# Patient Record
Sex: Female | Born: 2000 | Hispanic: No | Marital: Single | Smoking: Never smoker
Health system: Southern US, Community
[De-identification: ages and names within clinical notes are randomized; demographics above are authoritative.]

## PROBLEM LIST (undated history)

## (undated) DIAGNOSIS — Z789 Other specified health status: Secondary | ICD-10-CM

## (undated) DIAGNOSIS — B009 Herpesviral infection, unspecified: Secondary | ICD-10-CM

## (undated) DIAGNOSIS — T7840XA Allergy, unspecified, initial encounter: Secondary | ICD-10-CM

## (undated) DIAGNOSIS — F419 Anxiety disorder, unspecified: Secondary | ICD-10-CM

## (undated) DIAGNOSIS — F32A Depression, unspecified: Secondary | ICD-10-CM

## (undated) HISTORY — DX: Allergy, unspecified, initial encounter: T78.40XA

## (undated) HISTORY — DX: Herpesviral infection, unspecified: B00.9

## (undated) HISTORY — DX: Other specified health status: Z78.9

## (undated) HISTORY — PX: WISDOM TOOTH EXTRACTION: SHX21

## (undated) HISTORY — DX: Depression, unspecified: F32.A

## (undated) HISTORY — DX: Anxiety disorder, unspecified: F41.9

---
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
## 2019-01-02 ENCOUNTER — Encounter: Payer: Self-pay | Admitting: Family Medicine

## 2019-01-02 ENCOUNTER — Ambulatory Visit (INDEPENDENT_AMBULATORY_CARE_PROVIDER_SITE_OTHER): Payer: Managed Care, Other (non HMO) | Admitting: Family Medicine

## 2019-01-02 VITALS — BP 110/70 | HR 78 | Temp 98.4°F | Resp 12 | Ht 61.05 in | Wt 129.0 lb

## 2019-01-02 DIAGNOSIS — R05 Cough: Secondary | ICD-10-CM

## 2019-01-02 DIAGNOSIS — K219 Gastro-esophageal reflux disease without esophagitis: Secondary | ICD-10-CM

## 2019-01-02 DIAGNOSIS — R059 Cough, unspecified: Secondary | ICD-10-CM

## 2019-01-02 MED ORDER — BENZONATATE 100 MG PO CAPS: 200.0000 mg | ORAL_CAPSULE | Freq: Two times a day (BID) | ORAL | 0 refills | Status: AC | PRN

## 2019-01-02 MED ORDER — FLUTICASONE PROPIONATE 50 MCG/ACT NA SUSP: 1.0000 | Freq: Two times a day (BID) | NASAL | 3 refills | Status: DC

## 2019-01-02 NOTE — Patient Instructions (Addendum)
A few things to remember from today's visit:   Cough - Plan: benzonatate (TESSALON) 100 MG capsule  Gastroesophageal reflux disease without esophagitis  I do not think chest x-ray is needed today. ?  Allergies ?  GERD. We can try over-the-counter Zyrtec 10 mg in the morning and Flonase intranasal spray. You can also range her nose with saline water. If cough is not any better in 2 to 3 weeks then we can try a medication for acid reflux, omeprazole 20 mg.   Please be sure medication list is accurate. If a new problem present, please set up appointment sooner than planned today.

## 2019-01-02 NOTE — Progress Notes (Signed)
ACUTE VISIT  HPI:  Chief Complaint  Patient presents with  . Cough    productive cough for over a week    Daisy White is a 18 y.o.female here today with her mother complaining of 1 to 2 weeks of cough. Initially cough was nonproductive, for the past couple days she has had more productive cough.  She is unable to bring sputum up. She has not had other associated symptoms, since the beginning it has been just cough.  No associated wheezing or dyspnea.  Cough  This is a new problem. The current episode started 1 to 4 weeks ago. The problem has been unchanged. The cough is productive of sputum. Associated symptoms include chest pain, heartburn, postnasal drip and rhinorrhea. Pertinent negatives include no chills, ear congestion, ear pain, fever, headaches, hemoptysis, myalgias, nasal congestion, rash, sore throat, shortness of breath, sweats, weight loss or wheezing. The symptoms are aggravated by lying down. She has tried OTC cough suppressant for the symptoms. The treatment provided moderate relief. There is no history of asthma or environmental allergies.   No Hx of recent travel. No sick contact. No known insect bite.  She has no known history of allergies but both of her parents and her sister receive allergy shots.  OTC medications for this problem: Robitussin, NyQuil, and Zycam  She is reporting heartburn and mid chest pain a few days ago that lasted a few minutes.   Review of Systems  Constitutional: Negative for activity change, appetite change, chills, fatigue, fever and weight loss.  HENT: Positive for congestion, postnasal drip and rhinorrhea. Negative for ear pain, mouth sores, sinus pressure, sore throat and trouble swallowing.   Respiratory: Positive for cough. Negative for hemoptysis, shortness of breath and wheezing.   Cardiovascular: Positive for chest pain.  Gastrointestinal: Positive for heartburn. Negative for abdominal pain, diarrhea, nausea and  vomiting.  Musculoskeletal: Negative for back pain, joint swelling, myalgias and neck pain.  Skin: Negative for rash.  Allergic/Immunologic: Negative for environmental allergies.  Neurological: Negative for syncope, weakness and headaches.  Hematological: Negative for adenopathy. Does not bruise/bleed easily.    No current outpatient medications on file prior to visit.   No current facility-administered medications on file prior to visit.      Past Medical History:  Diagnosis Date  . Medical history non-contributory    No Known Allergies  Social History   Socioeconomic History  . Marital status: Single    Spouse name: Not on file  . Number of children: Not on file  . Years of education: Not on file  . Highest education level: Not on file  Occupational History  . Not on file  Social Needs  . Financial resource strain: Not on file  . Food insecurity:    Worry: Not on file    Inability: Not on file  . Transportation needs:    Medical: Not on file    Non-medical: Not on file  Tobacco Use  . Smoking status: Never Smoker  . Smokeless tobacco: Never Used  Substance and Sexual Activity  . Alcohol use: No  . Drug use: No  . Sexual activity: Never  Lifestyle  . Physical activity:    Days per week: Not on file    Minutes per session: Not on file  . Stress: Not on file  Relationships  . Social connections:    Talks on phone: Not on file    Gets together: Not on file  Attends religious service: Not on file    Active member of club or organization: Not on file    Attends meetings of clubs or organizations: Not on file    Relationship status: Not on file  Other Topics Concern  . Not on file  Social History Narrative  . Not on file    Vitals:   01/02/19 1110  BP: 110/70  Pulse: 78  Resp: 12  Temp: 98.4 F (36.9 C)  SpO2: 98%   Body mass index is 24.33 kg/m.  Physical Exam  Nursing note and vitals reviewed. Constitutional: She is oriented to person,  place, and time. She appears well-developed and well-nourished. She does not appear ill. No distress.  HENT:  Head: Normocephalic and atraumatic.  Nose: Rhinorrhea and septal deviation present. Right sinus exhibits no maxillary sinus tenderness. Left sinus exhibits no maxillary sinus tenderness.  Mouth/Throat: Oropharynx is clear and moist and mucous membranes are normal.  Right hypertrophic turbinate. Postnasal drainage.  Eyes: Conjunctivae are normal.  Cardiovascular: Normal rate and regular rhythm.  Murmur (soft SEM RUSB) heard. Respiratory: Effort normal and breath sounds normal. No respiratory distress.  A couple episodes of productive cough during visit.  GI: Soft. She exhibits no mass. There is abdominal tenderness in the epigastric area. There is no rigidity, no rebound and no guarding.  Lymphadenopathy:    She has no cervical adenopathy.  Neurological: She is alert and oriented to person, place, and time. She has normal strength. Gait normal.  Skin: Skin is warm. No rash noted. No erythema.  Psychiatric: She has a normal mood and affect.  Well groomed, good eye contact.    ASSESSMENT AND PLAN:   Jamy was seen today for cough.  Diagnoses and all orders for this visit:  Cough We discussed possible etiologies. She did not seem to have a URI recently. ?  GERD. ?  Allergies. Lung auscultation is negative today, so I do not think imaging is necessary.  I recommend trying first intranasal steroids, Flonase, and OTC antihistaminic like Zyrtec 10 mg daily.  If not any better in 2 to 3 weeks we can try a PPI, omeprazole 20 mg for a few weeks.  Instructed about warning signs.  -     benzonatate (TESSALON) 100 MG capsule; Take 2 capsules (200 mg total) by mouth 2 (two) times daily as needed for up to 10 days.  Gastroesophageal reflux disease without esophagitis GERD precautions recommended for now. This problem could be contributing to her cough. We will consider treatment  with PPIs if cough is persistent.   Return if symptoms worsen or fail to improve.    Lilyauna Miedema G. [place], MD  [facility]. [place] office.

## 2019-01-14 ENCOUNTER — Other Ambulatory Visit: Payer: Self-pay | Admitting: Family Medicine

## 2019-01-14 ENCOUNTER — Ambulatory Visit: Payer: Self-pay

## 2019-01-14 MED ORDER — DOXYCYCLINE HYCLATE 100 MG PO TABS: 100.0000 mg | ORAL_TABLET | Freq: Two times a day (BID) | ORAL | 0 refills | Status: AC

## 2019-01-14 NOTE — Progress Notes (Signed)
Because worsening respiratory symptoms, I did send prescription for doxycycline 100 mg to take twice daily for 7 days. Betty [place], MD

## 2019-01-14 NOTE — Telephone Encounter (Signed)
ret'd call to pt.  Reported onset of sinus congestion and pressure since approx. Sunday or Monday.  Reported she feels bilateral nasal swelling and stuffiness.  Stated she has moderate pain in nasal region, and headache.  C/o yellow-green nasal drainage.  Also reported intermittent productive cough with yellow-green phlegm.  C/o sore throat and post nasal drip.  Reported she came home flushed today; no fever when checked.  C/o intermittent chills during night.  Reported ringing in her ears and ear pressure last night; has since subsided.  Advised she will need to be evaluated in the office, before an antibiotic can be prescribed.  The pt. stated that her father is requesting to check with Dr. Martinique, to see if she will order an antibiotic, instead of having the pt. come into the office, and possibly be further exposed to the flu.  Asked if it would be possible to have Dr. Martinique or her nurse call and speak with pt. Advised will send Triage note to Dr. Martinique with this request.  Verb. Understanding.      Reason for Disposition . [1] Sinus pain (not just congestion) AND [2] fever    No fever; c/o chills at night.  C/o sinus pressure and yellow-green drainage x 3 days  Answer Assessment - Initial Assessment Questions 1. LOCATION: "Where does it hurt?"      Nasal pain and headache  2. ONSET: "When did the sinus pain start?"  (e.g., hours, days)     Sunday or Monday 3. SEVERITY: "How bad is the pain?"   (Scale 1-10; mild, moderate or severe)   - MILD (1-3): doesn't interfere with normal activities    - MODERATE (4-7): interferes with normal activities (e.g., work or school) or awakens from sleep   - SEVERE (8-10): excruciating pain and patient unable to do any normal activities        Moderate  4. RECURRENT SYMPTOM: "Have you ever had sinus problems before?" If so, ask: "When was the last time?" and "What happened that time?"      Hx of allergies and sinus infections 5. NASAL CONGESTION: "Is the nose  blocked?" If so, ask, "Can you open it or must you breathe through the mouth?"     Stuffiness/ swelling in bilat. Nasal passages 6. NASAL DISCHARGE: "Do you have discharge from your nose?" If so ask, "What color?"     Yellow green 7. FEVER: "Do you have a fever?" If so, ask: "What is it, how was it measured, and when did it start?"      Flushed, but no fever 8. OTHER SYMPTOMS: "Do you have any other symptoms?" (e.g., sore throat, cough, earache, difficulty breathing)     Sore throat with PND; painful throat this week; c/o ringing in ears and pressure last night; denied any shortness of breath   9. PREGNANCY: "Is there any chance you are pregnant?" "When was your last menstrual period?"     LMP: 7 days ago; ended today  Protocols used: SINUS PAIN OR CONGESTION-A-AH

## 2019-01-14 NOTE — Telephone Encounter (Signed)
Dr. Swaziland sent in antibiotic for patient , left vm informing that Rx had been sent to the pharmacy.

## 2019-02-25 ENCOUNTER — Other Ambulatory Visit: Payer: Self-pay | Admitting: Family Medicine

## 2019-10-20 ENCOUNTER — Ambulatory Visit (INDEPENDENT_AMBULATORY_CARE_PROVIDER_SITE_OTHER): Payer: Managed Care, Other (non HMO) | Admitting: Family Medicine

## 2019-10-20 ENCOUNTER — Encounter: Payer: Self-pay | Admitting: Family Medicine

## 2019-10-20 ENCOUNTER — Other Ambulatory Visit: Payer: Self-pay

## 2019-10-20 VITALS — BP 110/62 | HR 83 | Temp 97.3°F | Resp 12 | Ht 61.08 in | Wt 126.6 lb

## 2019-10-20 DIAGNOSIS — R300 Dysuria: Secondary | ICD-10-CM

## 2019-10-20 DIAGNOSIS — R310 Gross hematuria: Secondary | ICD-10-CM | POA: Diagnosis not present

## 2019-10-20 DIAGNOSIS — N390 Urinary tract infection, site not specified: Secondary | ICD-10-CM

## 2019-10-20 DIAGNOSIS — Z3009 Encounter for other general counseling and advice on contraception: Secondary | ICD-10-CM

## 2019-10-20 DIAGNOSIS — R319 Hematuria, unspecified: Secondary | ICD-10-CM

## 2019-10-20 LAB — POCT URINALYSIS DIPSTICK
Bilirubin, UA: NEGATIVE
Blood, UA: NEGATIVE
Glucose, UA: NEGATIVE
Ketones, UA: NEGATIVE
Nitrite, UA: NEGATIVE
Protein, UA: NEGATIVE
Spec Grav, UA: 1.015 (ref 1.010–1.025)
Urobilinogen, UA: 0.2 E.U./dL
pH, UA: 6 (ref 5.0–8.0)

## 2019-10-20 MED ORDER — NITROFURANTOIN MONOHYD MACRO 100 MG PO CAPS: 100.0000 mg | ORAL_CAPSULE | Freq: Two times a day (BID) | ORAL | 0 refills | Status: AC

## 2019-10-20 NOTE — Patient Instructions (Signed)
A few things to remember from today's visit:   Dysuria - Plan: POC Urinalysis Dipstick, Culture, Urine  Gross hematuria  Urinary tract infection with hematuria, site unspecified - Plan: nitrofurantoin, macrocrystal-monohydrate, (MACROBID) 100 MG capsule    Adequate fluid intake, avoid holding urine for long hours, and over the counter Vit C OR cranberry capsules might help.  Today we will treat empirically with antibiotic, which we might need to change when urine culture comes back depending of bacteria susceptibility.  Seek immediate medical attention if severe abdominal pain, vomiting, fever/chills, or worsening symptoms. F/U if symptomatic are not any better after 2-3 days of antibiotic treatment.  Please be sure medication list is accurate. If a new problem present, please set up appointment sooner than planned today.

## 2019-10-20 NOTE — Progress Notes (Signed)
ACUTE VISIT   HPI:  Chief Complaint  Patient presents with  . Dysuria    Daisy White is a 18 y.o. female, who is here today complaining of a day of gross hematuria,dysuria,urinary frequency,and suprapubic abdominal pain. Negative for urine urgency or incontinence.  Mild lower back pain,bilateral,no radiated. No Hx of back pain.  Suprapubic abdominal pain is intermittent cramps , 5-6/10. Symptoms seem to be getting worse.  Denies associated fever,chills,decreased appetite,nausea,vomiting,changes in bowel habits,vaginal bleeding,or discharge.  No hx of UTI or nephrolithiasis. She has taken Advil for pain.  She just started sexual activity and she would like to discuss birth control options. One sexual partner. LMP 1 week ago.   Review of Systems  Constitutional: Negative for activity change and fatigue.  HENT: Negative for mouth sores and nosebleeds.   Gastrointestinal: Negative for abdominal pain, nausea and vomiting.  Genitourinary: Negative for decreased urine volume, difficulty urinating, dyspareunia and genital sores.  Musculoskeletal: Positive for back pain. Negative for arthralgias and gait problem.  Skin: Negative for pallor and rash.  Neurological: Negative for weakness and headaches.  Rest see pertinent positives and negatives per HPI.   Current Outpatient Medications on File Prior to Visit  Medication Sig Dispense Refill  . fluticasone (FLONASE) 50 MCG/ACT nasal spray PLACE 1 SPRAY INTO BOTH NOSTRILS 2 (TWO) TIMES DAILY. 16 g 3   No current facility-administered medications on file prior to visit.      Past Medical History:  Diagnosis Date  . Medical history non-contributory    No Known Allergies  Social History   Socioeconomic History  . Marital status: Single    Spouse name: Not on file  . Number of children: Not on file  . Years of education: Not on file  . Highest education level: Not on file  Occupational History  . Not on file   Social Needs  . Financial resource strain: Not on file  . Food insecurity    Worry: Not on file    Inability: Not on file  . Transportation needs    Medical: Not on file    Non-medical: Not on file  Tobacco Use  . Smoking status: Never Smoker  . Smokeless tobacco: Never Used  Substance and Sexual Activity  . Alcohol use: No  . Drug use: No  . Sexual activity: Never  Lifestyle  . Physical activity    Days per week: Not on file    Minutes per session: Not on file  . Stress: Not on file  Relationships  . Social Herbalist on phone: Not on file    Gets together: Not on file    Attends religious service: Not on file    Active member of club or organization: Not on file    Attends meetings of clubs or organizations: Not on file    Relationship status: Not on file  Other Topics Concern  . Not on file  Social History Narrative  . Not on file    Vitals:   10/20/19 1534  BP: 110/62  Pulse: 83  Resp: 12  Temp: (!) 97.3 F (36.3 C)  SpO2: 98%   Body mass index is 23.86 kg/m.   Physical Exam  Nursing note and vitals reviewed. Constitutional: She is oriented to person, place, and time. She appears well-developed and well-nourished. No distress.  HENT:  Head: Normocephalic and atraumatic.  Mouth/Throat: Oropharynx is clear and moist and mucous membranes are normal.  Eyes: Conjunctivae  are normal.  Cardiovascular: Normal rate and regular rhythm.  Respiratory: Effort normal and breath sounds normal. No respiratory distress.  GI: Soft. She exhibits no mass. There is no abdominal tenderness. There is no CVA tenderness.  Musculoskeletal:        General: No edema.  Lymphadenopathy:    She has no cervical adenopathy.  Neurological: She is alert and oriented to person, place, and time. She has normal strength. Gait normal.  Skin: Skin is warm. No erythema.  Psychiatric: She has a normal mood and affect.  Good eye contact,well groomed.    ASSESSMENT AND  PLAN:   Myasia was seen today for dysuria.  Diagnoses and all orders for this visit:  Dysuria Urine dipstick with 1+ leuk,rest negative. Will follow Ucx results.  -     POC Urinalysis Dipstick -     Culture, Urine  Gross hematuria Possible etiologies discussed. For now no further studies , will treat as UTI. Instructed about warning signs.  Urinary tract infection with hematuria, site unspecified Empiric abx treatment started today and will be tailored according to Ucx results and susceptibility report.  Clearly instructed about warning signs. F/U if symptoms persist.  -     nitrofurantoin, macrocrystal-monohydrate, (MACROBID) 100 MG capsule; Take 1 capsule (100 mg total) by mouth 2 (two) times daily for 5 days.  Encounter for counseling regarding contraception We discussed options: OCP,nexplanon,IUD mirena,Nuvaring, patches,and depo Provera. Reviewed side effects of hormonal therapy. Explained that birth control do not prevent STD's,so encouraged condom use.  She would like to talk with her mother before she makes a decision.   25 min face to face OV. > 50% was dedicated to discussion of Dx's, prognosis, treatment options, and some side effects of medications.    Return if symptoms worsen or fail to improve.   Regan Llorente G. [place], MD  [facility]. [place] office.

## 2019-10-22 LAB — URINE CULTURE
MICRO NUMBER:: 1150942
SPECIMEN QUALITY:: ADEQUATE

## 2019-11-23 ENCOUNTER — Telehealth (INDEPENDENT_AMBULATORY_CARE_PROVIDER_SITE_OTHER): Payer: Managed Care, Other (non HMO) | Admitting: Family Medicine

## 2019-11-23 DIAGNOSIS — R197 Diarrhea, unspecified: Secondary | ICD-10-CM

## 2019-11-23 DIAGNOSIS — R1110 Vomiting, unspecified: Secondary | ICD-10-CM | POA: Diagnosis not present

## 2019-11-23 DIAGNOSIS — Z7189 Other specified counseling: Secondary | ICD-10-CM | POA: Diagnosis not present

## 2019-11-23 NOTE — Progress Notes (Signed)
Virtual Visit via Video Note  I connected with Daisy White on 11/23/19 at  1:00 PM EST by a video enabled telemedicine application 2/2 COVID-19 pandemic and verified that I am speaking with the correct person using two identifiers.  Location patient: home Location provider:work or home office Persons participating in the virtual visit: patient, provider  I discussed the limitations of evaluation and management by telemedicine and the availability of in person appointments. The patient expressed understanding and agreed to proceed.   HPI: Pt is a 19 yo female w/ no sig pmh followed by Dr. Sw[place]il[place].  Pt seen for acute issue.   Endorses stomach ache on Thursday.  Symptoms progressed into vomiting and diarrhea that evening which lasted a few days.  Pt notes resolution of symptoms today.  Able to eat and drink without issue.  Pt denies HA, fever, cough, sore throat, facial pain/pressure, sick contacts.  Pt had [employer] pizza on Thursday which was out of the ordinary.  Also endorses recent EtOH consumption.  Pt states her parents think her symptoms may be COVID-19.  Pt's mom has surgery tomorrow, was advised to monitor for symptoms.  Pt is staying in her room away from her parents.  Scheduled a COVID test at [facility] in a few days.  Pt mentions she has an office appt on Wed for her first Depo injection.  ROS: See pertinent positives and negatives per HPI.  Past Medical History:  Diagnosis Date  . Medical history non-contributory     No past surgical history on file.  Family History  Problem Relation Age of Onset  . Hypertension Mother   . Depression Mother   . Hypertension Father   . Heart disease Maternal Grandmother   . Diabetes Maternal Grandmother   . Cancer Maternal Grandfather   . ADD / ADHD Sister      Current Outpatient Medications:  .  fluticasone (FLONASE) 50 MCG/ACT nasal spray, PLACE 1 SPRAY INTO BOTH NOSTRILS 2 (TWO) TIMES DAILY., Disp: 16 g, Rfl: 3  EXAM:  VITALS per  patient if applicable:  GENERAL: alert, oriented, appears well and in no acute distress  HEENT: atraumatic, conjunctiva clear, no obvious abnormalities on inspection of external nose and ears  NECK: normal movements of the head and neck  LUNGS: on inspection no signs of respiratory distress, breathing rate appears normal, no obvious gross SOB, gasping or wheezing  CV: no obvious cyanosis  MS: moves all visible extremities without noticeable abnormality  PSYCH/NEURO: pleasant and cooperative, no obvious depression or anxiety, speech and thought processing grossly intact  ASSESSMENT AND PLAN:  Discussed the following assessment and plan:  Vomiting and diarrhea -resolved -likely 2/2 viral gastroenteritis -continue supportive care: bland diet, imodium prn.  If needed can send rx for zofran. -given precautions  Educated about COVID-19 virus infection -discussed s/s -given info on area COVID testing sites. -consider testing for continued or worsened symptoms  F/u prn.  Pt advised to consider rescheduling appt Wed given recent symptoms.     I discussed the assessment and treatment plan with the patient. The patient was provided an opportunity to ask questions and all were answered. The patient agreed with the plan and demonstrated an understanding of the instructions.   The patient was advised to call back or seek an in-person evaluation if the symptoms worsen or if the condition fails to improve as anticipated.   Deeann Saint, MD

## 2019-11-24 ENCOUNTER — Telehealth: Payer: Self-pay

## 2019-11-24 NOTE — Telephone Encounter (Signed)
Copied from CRM [id number]. Topic: General - Other >> Nov 24, 2019  1:20 PM Daisy White A wrote: Patient stated that she was advised to leave a message for Dr. [place] in regards to her feeling better and still wanting to attend her appointment in office tomorrow. Please advise

## 2019-11-25 ENCOUNTER — Encounter: Payer: Self-pay | Admitting: Family Medicine

## 2019-11-25 ENCOUNTER — Telehealth (INDEPENDENT_AMBULATORY_CARE_PROVIDER_SITE_OTHER): Payer: Managed Care, Other (non HMO) | Admitting: Family Medicine

## 2019-11-25 ENCOUNTER — Other Ambulatory Visit: Payer: Self-pay

## 2019-11-25 DIAGNOSIS — K529 Noninfective gastroenteritis and colitis, unspecified: Secondary | ICD-10-CM | POA: Diagnosis not present

## 2019-11-25 DIAGNOSIS — R109 Unspecified abdominal pain: Secondary | ICD-10-CM

## 2019-11-25 DIAGNOSIS — Z3009 Encounter for other general counseling and advice on contraception: Secondary | ICD-10-CM

## 2019-11-25 NOTE — Progress Notes (Addendum)
Virtual Visit via Video Note   I connected with Daisy White on 11/25/19 by a video enabled telemedicine application and verified that I am speaking with the correct person using two identifiers.  Location patient: home Location provider:work office Persons participating in the virtual visit: patient, provider  I discussed the limitations of evaluation and management by telemedicine and the availability of in person appointments. The patient expressed understanding and agreed to proceed.   HPI: Daisy White is a 19 yo female c/o abdominal pain that started yesterday,initially cramps and now achy like pain,3/10. Pain has been constant. She has not identified exacerbating or alleviating factors. She was evaluated recently for GI symptoms, 11/23/2019.  She had some abdominal pain about a week ago, associated N/V and diarrhea; all these have resolved. Occasionally back pain. Abdominal pain first noted in RUQ and now in LUQ.  She has taken Ibuprofen and Tylenol.  Last bowel movement yesterday,formed. No melena or blood in stool. No urinary symptoms. She has been in quarantine to date. She lives with her parents and they have not been sick. She stayed with her boyfriend x 2 days (4 days ago). Negative for fever, chills, unusual fatigue,appetite changes, sore throat, runny nose, nasal congestion, cough, wheezing,SOB, and skin rash.  Mild body aches.  No changes in the smell or taste. She has appointment today for COVID-19 screen.  On 10/20/19 we discussed birth control options, she has decided to try Depo Provera. LMP 15 days ago.  ROS: See pertinent positives and negatives per HPI.  Past Medical History:  Diagnosis Date  . Medical history non-contributory     No past surgical history on file.  Family History  Problem Relation Age of Onset  . Hypertension Mother   . Depression Mother   . Hypertension Father   . Heart disease Maternal Grandmother   . Diabetes Maternal Grandmother   .  Cancer Maternal Grandfather   . ADD / ADHD Sister     Social History   Socioeconomic History  . Marital status: Single    Spouse name: Not on file  . Number of children: Not on file  . Years of education: Not on file  . Highest education level: Not on file  Occupational History  . Not on file  Tobacco Use  . Smoking status: Never Smoker  . Smokeless tobacco: Never Used  Substance and Sexual Activity  . Alcohol use: No  . Drug use: No  . Sexual activity: Never  Other Topics Concern  . Not on file  Social History Narrative  . Not on file   Social Determinants of Health   Financial Resource Strain:   . Difficulty of Paying Living Expenses: Not on file  Food Insecurity:   . Worried About Programme researcher, broadcasting/film/video in the Last Year: Not on file  . Ran Out of Food in the Last Year: Not on file  Transportation Needs:   . Lack of Transportation (Medical): Not on file  . Lack of Transportation (Non-Medical): Not on file  Physical Activity:   . Days of Exercise per Week: Not on file  . Minutes of Exercise per Session: Not on file  Stress:   . Feeling of Stress : Not on file  Social Connections:   . Frequency of Communication with Friends and Family: Not on file  . Frequency of Social Gatherings with Friends and Family: Not on file  . Attends Religious Services: Not on file  . Active Member of Clubs or Organizations: Not on file  .  Attends Archivist Meetings: Not on file  . Marital Status: Not on file  Intimate Partner Violence:   . Fear of Current or Ex-Partner: Not on file  . Emotionally Abused: Not on file  . Physically Abused: Not on file  . Sexually Abused: Not on file    Current Outpatient Medications:  .  fluticasone (FLONASE) 50 MCG/ACT nasal spray, PLACE 1 SPRAY INTO BOTH NOSTRILS 2 (TWO) TIMES DAILY., Disp: 16 g, Rfl: 3  EXAM:  VITALS per patient if applicable:LMP 53/74/8270   GENERAL: alert, oriented, appears well and in no acute distress  HEENT:  atraumatic, conjunctiva clear, no obvious abnormalities on inspection.  NECK: normal movements of the head and neck  LUNGS: on inspection no signs of respiratory distress, breathing rate appears normal, no obvious gross SOB, gasping or wheezing  CV: no obvious cyanosis  PSYCH/NEURO: pleasant and cooperative, no obvious depression or anxiety, speech and thought processing grossly intact  ASSESSMENT AND PLAN:  Discussed the following assessment and plan:  Abdominal pain, unspecified abdominal location We discussed possible etiologies:? Dyspepsia. This could also be related to viral gastroenteritis. Recommend small portions and bland diet for now. History does not suggest a serious process, so I do not think imaging is needed at this time. Clearly instructed about warning signs.  I will need to see her in the office if problem is persistent.  Gastroenteritis Most symptoms have resolved. Adequate hydration. Continue contact precautions. Pending COVID-19 testing today.  Encounter for counseling regarding contraception We will start Depo-Provera injections once above problem resolves.   I discussed the assessment and treatment plan with the patient. She was provided an opportunity to ask questions and all were answered. She agreed with the plan and demonstrated an understanding of the instructions.    Return if symptoms worsen or fail to improve.    Leafy Motsinger [place], MD

## 2020-06-21 ENCOUNTER — Ambulatory Visit: Payer: Managed Care, Other (non HMO) | Admitting: Family Medicine

## 2020-06-21 ENCOUNTER — Encounter: Payer: Self-pay | Admitting: Family Medicine

## 2020-06-21 ENCOUNTER — Other Ambulatory Visit: Payer: Self-pay

## 2020-06-21 VITALS — BP 110/70 | HR 86 | Temp 97.9°F | Resp 12 | Ht 61.08 in | Wt 128.4 lb

## 2020-06-21 DIAGNOSIS — F320 Major depressive disorder, single episode, mild: Secondary | ICD-10-CM | POA: Diagnosis not present

## 2020-06-21 DIAGNOSIS — Z3009 Encounter for other general counseling and advice on contraception: Secondary | ICD-10-CM | POA: Diagnosis not present

## 2020-06-21 DIAGNOSIS — Z111 Encounter for screening for respiratory tuberculosis: Secondary | ICD-10-CM | POA: Diagnosis not present

## 2020-06-21 DIAGNOSIS — F332 Major depressive disorder, recurrent severe without psychotic features: Secondary | ICD-10-CM | POA: Insufficient documentation

## 2020-06-21 NOTE — Progress Notes (Signed)
Chief Complaint  Patient presents with   Contraception   HPI: Daisy White is a 19 y.o. female, who is here today complaining of depressed mood, which she attributes to OCP. Started on OCP's after having an abortion (8 weeks) in 02/2020.  OCP's have helps with menstrual flow and dysmenorrhea. She has not tried other birth control in the past,except for condoms for the past few months.  No motivation and decreased enjoyment of activities she used to love. Denies suicidal thoughts. Feeling irritable. She has not been on medication before. + Fatigue. Sleeping 7-8 hours.  Stressful relation with her father,she moved out of her parents' house. She keeps often communication with her mother. She is now living with her boyfriend and his brother. She feels safe at home.  She is trying to establish with psychotherapist.  Boyfriend recently started on Wellbutrin for depression.  Needs form to be completed and TB screening for school,major in biology.  Review of Systems  Constitutional: Negative for chills and fever.  Respiratory: Negative for shortness of breath.   Cardiovascular: Negative for chest pain and palpitations.  Gastrointestinal: Negative for abdominal pain, nausea and vomiting.  Genitourinary: Negative for decreased urine volume, dysuria, hematuria, pelvic pain, vaginal bleeding and vaginal discharge.  Musculoskeletal: Negative for gait problem and myalgias.  Neurological: Negative for syncope, weakness and headaches.  Psychiatric/Behavioral: Negative for behavioral problems and confusion.  Rest see pertinent positives and negatives per HPI.  Current Outpatient Medications on File Prior to Visit  Medication Sig Dispense Refill   fluticasone (FLONASE) 50 MCG/ACT nasal spray PLACE 1 SPRAY INTO BOTH NOSTRILS 2 (TWO) TIMES DAILY. 16 g 3   SPRINTEC 28 0.25-35 MG-MCG tablet Take 1 tablet by mouth daily.     No current facility-administered medications on file prior  to visit.    Past Medical History:  Diagnosis Date   Medical history non-contributory    No Known Allergies  Social History   Socioeconomic History   Marital status: Single    Spouse name: Not on file   Number of children: Not on file   Years of education: Not on file   Highest education level: Not on file  Occupational History   Not on file  Tobacco Use   Smoking status: Never Smoker   Smokeless tobacco: Never Used  Substance and Sexual Activity   Alcohol use: No   Drug use: No   Sexual activity: Never  Other Topics Concern   Not on file  Social History Narrative   Not on file   Social Determinants of Health   Financial Resource Strain:    Difficulty of Paying Living Expenses:   Food Insecurity:    Worried About Programme researcher, broadcasting/film/video in the Last Year:    Barista in the Last Year:   Transportation Needs:    Freight forwarder (Medical):    Lack of Transportation (Non-Medical):   Physical Activity:    Days of Exercise per Week:    Minutes of Exercise per Session:   Stress:    Feeling of Stress :   Social Connections:    Frequency of Communication with Friends and Family:    Frequency of Social Gatherings with Friends and Family:    Attends Religious Services:    Active Member of Clubs or Organizations:    Attends Banker Meetings:    Marital Status:    Vitals:   06/21/20 1451  BP: 110/70  Pulse: 86  Resp:  12  Temp: 97.9 F (36.6 C)  SpO2: 97%   Body mass index is 24.19 kg/m.  Physical Exam Vitals and nursing note reviewed.  Constitutional:      General: She is not in acute distress.    Appearance: She is well-developed and normal weight.  HENT:     Head: Normocephalic and atraumatic.  Eyes:     Conjunctiva/sclera: Conjunctivae normal.     Pupils: Pupils are equal, round, and reactive to light.  Cardiovascular:     Rate and Rhythm: Normal rate and regular rhythm.     Heart sounds: No murmur  heard.   Pulmonary:     Effort: Pulmonary effort is normal. No respiratory distress.     Breath sounds: Normal breath sounds.  Abdominal:     Palpations: Abdomen is soft. There is no hepatomegaly or mass.     Tenderness: There is no abdominal tenderness.  Lymphadenopathy:     Cervical: No cervical adenopathy.  Skin:    General: Skin is warm.     Findings: No erythema or rash.  Neurological:     Mental Status: She is alert and oriented to person, place, and time.     Cranial Nerves: No cranial nerve deficit.     Gait: Gait normal.  Psychiatric:     Comments: Well groomed, good eye contact.   ASSESSMENT AND PLAN:  Daisy White was seen today for contraception.  Diagnoses and all orders for this visit:  Encounter for counseling regarding contraception We discussed options. IUD,implant,oral injectable. Side effects discussed. She would like to continue current OCP for a little linger and will let me know if she decides about Mirana or Depo Provera. STD prevention discussed.  Screening-pulmonary TB -     QuantiFERON-TB Gold Plus  Depression, major, single episode, mild (HCC) She prefers to hold on pharmacologic treatment. She is planning on establishing with psychologist/psychotherapist.   Return in about 1 year (around 06/21/2021), or if symptoms worsen or fail to improve.   Braxon Suder G. [place], MD  [facility]. [place] office.  Discharge Instructions       A few things to remember from today's visit:  Continue oral birth control. Let me know if you want Depo Provera. Try to establish with therapist.  If you need refills please call your pharmacy. Do not use My Chart to request refills or for acute issues that need immediate attention.    Please be sure medication list is accurate. If a new problem present, please set up appointment sooner than planned today.

## 2020-06-21 NOTE — Patient Instructions (Addendum)
A few things to remember from today's visit:  Continue oral birth control. Let me know if you want Depo Provera. Try to establish with therapist.  If you need refills please call your pharmacy. Do not use My Chart to request refills or for acute issues that need immediate attention.    Please be sure medication list is accurate. If a new problem present, please set up appointment sooner than planned today.

## 2020-06-23 LAB — QUANTIFERON-TB GOLD PLUS
Mitogen-NIL: >10 IU/mL
NIL: 0.02 IU/mL
QuantiFERON-TB Gold Plus: NEGATIVE
TB1-NIL: 0 IU/mL
TB2-NIL: 0.02 IU/mL

## 2020-08-25 ENCOUNTER — Ambulatory Visit (INDEPENDENT_AMBULATORY_CARE_PROVIDER_SITE_OTHER): Payer: 59 | Admitting: Psychology

## 2020-08-25 DIAGNOSIS — F331 Major depressive disorder, recurrent, moderate: Secondary | ICD-10-CM

## 2020-08-25 DIAGNOSIS — F411 Generalized anxiety disorder: Secondary | ICD-10-CM | POA: Diagnosis not present

## 2020-08-31 ENCOUNTER — Ambulatory Visit (INDEPENDENT_AMBULATORY_CARE_PROVIDER_SITE_OTHER): Payer: 59 | Admitting: Psychology

## 2020-08-31 DIAGNOSIS — F411 Generalized anxiety disorder: Secondary | ICD-10-CM

## 2020-08-31 DIAGNOSIS — F331 Major depressive disorder, recurrent, moderate: Secondary | ICD-10-CM | POA: Diagnosis not present

## 2020-09-02 ENCOUNTER — Encounter: Payer: Self-pay | Admitting: Family Medicine

## 2020-09-02 ENCOUNTER — Ambulatory Visit: Payer: Managed Care, Other (non HMO) | Admitting: Family Medicine

## 2020-09-02 ENCOUNTER — Other Ambulatory Visit: Payer: Self-pay

## 2020-09-02 VITALS — BP 110/70 | HR 84 | Resp 12 | Ht 61.08 in | Wt 130.5 lb

## 2020-09-02 DIAGNOSIS — B001 Herpesviral vesicular dermatitis: Secondary | ICD-10-CM | POA: Diagnosis not present

## 2020-09-02 DIAGNOSIS — Z23 Encounter for immunization: Secondary | ICD-10-CM | POA: Diagnosis not present

## 2020-09-02 MED ORDER — VALACYCLOVIR HCL 1 G PO TABS: ORAL_TABLET | ORAL | 0 refills | Status: DC

## 2020-09-02 NOTE — Patient Instructions (Addendum)
A few things to remember from today's visit:  Recurrent herpes labialis - Plan: valACYclovir (VALTREX) 1000 MG tablet, DISCONTINUED: valACYclovir (VALTREX) 1000 MG tablet  If you need refills please call your pharmacy. Do not use My Chart to request refills or for acute issues that need immediate attention.    Please be sure medication list is accurate. If a new problem present, please set up appointment sooner than planned today.  Cold Sore  A cold sore, also called a fever blister, is a small, fluid-filled sore that forms inside of the mouth or on the lips, gums, nose, chin, or cheeks. Cold sores can spread to other parts of the body, such as the eyes or fingers. Cold sores can spread from person to person (are contagious) until the sores crust over completely. Most cold sores go away within 2 weeks. What are the causes? Cold sores are caused by a virus (herpes simplex virus type 1, HSV-1). The virus can spread from person to person through close contact, such as through:  Kissing.  Touching the affected area.  Sharing personal items such as lip balm, razors, a drinking glass, or eating utensils. What increases the risk? You are more likely to develop this condition if you:  Are tired, stressed, or sick.  Are having your period (menstruating).  Are pregnant.  Take certain medicines.  Are out in cold weather or get too much sun. What are the signs or symptoms? Symptoms of a cold sore outbreak go through different stages. These are the stages of a cold sore:  Tingling, itching, or burning is felt 1-2 days before the outbreak.  Fluid-filled blisters appear on the lips, inside the mouth, on the nose, or on the cheeks.  The blisters start to ooze clear fluid.  The blisters dry up, and a yellow crust appears in their place.  The crust falls off. In some cases, other symptoms can develop during a cold sore outbreak. These can include:  Fever.  Sore  throat.  Headache.  Muscle aches.  Swollen neck glands. How is this treated? There is no cure for cold sores or the virus that causes them. There is also no vaccine to prevent the virus. Most cold sores go away on their own without treatment within 2 weeks. Your doctor may prescribe medicines to:  Help with pain.  Keep the virus from growing.  Help you heal faster. Medicines may be in the form of creams, gels, pills, or a shot. Follow these instructions at home: Medicines  Take or apply over-the-counter and prescription medicines only as told by your doctor.  Use a cotton-tip swab to apply creams or gels to your sores.  Ask your doctor if you can take lysine supplements. These may help with healing. Sore care   Do not touch the sores or pick the scabs.  Wash your hands often. Do not touch your eyes without washing your hands first.  Keep the sores clean and dry.  If told, put ice on the sores: ? Put ice in a plastic bag. ? Place a towel between your skin and the bag. ? Leave the ice on for 20 minutes, 2-3 times a day. Eating and drinking  Eat a soft, bland diet. Avoid eating hot, cold, or salty foods. These can hurt your mouth.  Use a straw if it hurts to drink out of a glass.  Eat foods that have a lot of lysine in them. These include meat, fish, and dairy products.  Avoid sugary foods,  chocolates, nuts, and grains. These foods have a high amount of a substance (arginine) that can cause the virus to grow. Lifestyle  Do not kiss, have oral sex, or share personal items until your sores heal.  Stress, poor sleep, and being out in the sun can trigger outbreaks. Make sure you: ? Do activities that help you relax, such as deep breathing exercises or meditation. ? Get enough sleep. ? Apply sunscreen on your lips before you go out in the sun. Contact a doctor if:  You have symptoms for more than 2 weeks.  You have pus coming from the sores.  You have redness that  is spreading.  You have pain or irritation in your eye.  You get sores on your genitals.  Your sores do not heal within 2 weeks.  You get cold sores often. Get help right away if:  You have a fever and your symptoms suddenly get worse.  You have a headache and confusion.  You have tiredness (fatigue).  You do not want to eat as much as normal (loss of appetite).  You have a stiff neck or are sensitive to light. Summary  A cold sore is a small, fluid-filled sore that forms inside of the mouth or on the lips, gums, nose, chin, or cheeks.  Cold sores can spread from person to person (are contagious) until the sores crust over completely. Most cold sores go away within 2 weeks.  Wash your hands often. Do not touch your eyes without washing your hands first.  Do not kiss, have oral sex, or share personal items until your sores heal.  Contact a doctor if your sores do not heal within 2 weeks. This information is not intended to replace advice given to you by your health care provider. Make sure you discuss any questions you have with your health care provider. Document Revised: 02/25/2019 Document Reviewed: 04/07/2018 Elsevier Patient Education  2020 [employer].

## 2020-09-02 NOTE — Progress Notes (Signed)
Chief Complaint  Patient presents with  . Mouth Lesions   HPI: Daisy White is a 19 y.o. female, who is here today with above complaint. She has had similar lesions since age 81 but seldom. She has had a couple lesions on upper lip, just recovered from one a few days ago and noted a new one 6 days ago, healing. She has used OTC Abreva in the aast but it has not helped.   She has been under some stress, school tests, but feels better because she got a good grade.  Negative for fever,chills,sore throat,or body aches.  Review of Systems  Constitutional: Negative for appetite change and fatigue.  Respiratory: Negative for cough, shortness of breath and stridor.   Skin: Negative for rash and wound.  Allergic/Immunologic: Positive for environmental allergies.  Rest see pertinent positives and negatives per HPI.  Current Outpatient Medications on File Prior to Visit  Medication Sig Dispense Refill  . SPRINTEC 28 0.25-35 MG-MCG tablet Take 1 tablet by mouth daily.     No current facility-administered medications on file prior to visit.     Past Medical History:  Diagnosis Date  . Medical history non-contributory    No Known Allergies  Social History   Socioeconomic History  . Marital status: Single    Spouse name: Not on file  . Number of children: Not on file  . Years of education: Not on file  . Highest education level: Not on file  Occupational History  . Not on file  Tobacco Use  . Smoking status: Never Smoker  . Smokeless tobacco: Never Used  Substance and Sexual Activity  . Alcohol use: No  . Drug use: No  . Sexual activity: Never  Other Topics Concern  . Not on file  Social History Narrative  . Not on file   Social Determinants of Health   Financial Resource Strain:   . Difficulty of Paying Living Expenses: Not on file  Food Insecurity:   . Worried About Programme researcher, broadcasting/film/video in the Last Year: Not on file  . Ran Out of Food in the Last Year: Not on  file  Transportation Needs:   . Lack of Transportation (Medical): Not on file  . Lack of Transportation (Non-Medical): Not on file  Physical Activity:   . Days of Exercise per Week: Not on file  . Minutes of Exercise per Session: Not on file  Stress:   . Feeling of Stress : Not on file  Social Connections:   . Frequency of Communication with Friends and Family: Not on file  . Frequency of Social Gatherings with Friends and Family: Not on file  . Attends Religious Services: Not on file  . Active Member of Clubs or Organizations: Not on file  . Attends Banker Meetings: Not on file  . Marital Status: Not on file    Vitals:   09/02/20 1536  BP: 110/70  Pulse: 84  Resp: 12  SpO2: 98%   Body mass index is 24.59 kg/m.  Physical Exam Vitals and nursing note reviewed.  Constitutional:      Appearance: She is well-groomed and normal weight.  HENT:     Head: Normocephalic and atraumatic.     Comments: On upper lip a flat, rounded, superficial ulcer. 2-3 mm.     Mouth/Throat:     Mouth: Mucous membranes are moist.     Tongue: No lesions.     Pharynx: Oropharynx is clear.  Eyes:     Conjunctiva/sclera: Conjunctivae normal.  Pulmonary:     Effort: Pulmonary effort is normal.     Breath sounds: Normal breath sounds.  Lymphadenopathy:     Head:     Right side of head: No submandibular adenopathy.     Left side of head: No submandibular adenopathy.     Cervical: No cervical adenopathy.  Skin:    Findings: No erythema or rash.  Neurological:     General: No focal deficit present.     Mental Status: She is alert and oriented to person, place, and time.     Cranial Nerves: No cranial nerve deficit.     Gait: Gait normal.    ASSESSMENT AND PLAN:  Daisy White was seen today for mouth lesions.  Diagnoses and all orders for this visit:  Recurrent herpes labialis Getting worse. We discussed Dx,prognosis,and treatment options. Medication will not help with current  episode, > 48 hours. Valtrex to use for future outbreaks, start asap.  -     valACYclovir (VALTREX) 1000 MG tablet; 2 tabs bid x 1 day , start asap when symptoms start.  Need for influenza vaccination -     Flu Vaccine QUAD 36+ mos IM   Return if symptoms worsen or fail to improve.   Daisy White G. [place], MD  [facility]. [place] office.   A few things to remember from today's visit:  Recurrent herpes labialis - Plan: valACYclovir (VALTREX) 1000 MG tablet, DISCONTINUED: valACYclovir (VALTREX) 1000 MG tablet  If you need refills please call your pharmacy. Do not use My Chart to request refills or for acute issues that need immediate attention.    Please be sure medication list is accurate. If a new problem present, please set up appointment sooner than planned today.  Cold Sore  A cold sore, also called a fever blister, is a small, fluid-filled sore that forms inside of the mouth or on the lips, gums, nose, chin, or cheeks. Cold sores can spread to other parts of the body, such as the eyes or fingers. Cold sores can spread from person to person (are contagious) until the sores crust over completely. Most cold sores go away within 2 weeks. What are the causes? Cold sores are caused by a virus (herpes simplex virus type 1, HSV-1). The virus can spread from person to person through close contact, such as through:  Kissing.  Touching the affected area.  Sharing personal items such as lip balm, razors, a drinking glass, or eating utensils. What increases the risk? You are more likely to develop this condition if you:  Are tired, stressed, or sick.  Are having your period (menstruating).  Are pregnant.  Take certain medicines.  Are out in cold weather or get too much sun. What are the signs or symptoms? Symptoms of a cold sore outbreak go through different stages. These are the stages of a cold sore:  Tingling, itching, or burning is felt 1-2 days before the  outbreak.  Fluid-filled blisters appear on the lips, inside the mouth, on the nose, or on the cheeks.  The blisters start to ooze clear fluid.  The blisters dry up, and a yellow crust appears in their place.  The crust falls off. In some cases, other symptoms can develop during a cold sore outbreak. These can include:  Fever.  Sore throat.  Headache.  Muscle aches.  Swollen neck glands. How is this treated? There is no cure for cold sores or the virus that  causes them. There is also no vaccine to prevent the virus. Most cold sores go away on their own without treatment within 2 weeks. Your doctor may prescribe medicines to:  Help with pain.  Keep the virus from growing.  Help you heal faster. Medicines may be in the form of creams, gels, pills, or a shot. Follow these instructions at home: Medicines  Take or apply over-the-counter and prescription medicines only as told by your doctor.  Use a cotton-tip swab to apply creams or gels to your sores.  Ask your doctor if you can take lysine supplements. These may help with healing. Sore care   Do not touch the sores or pick the scabs.  Wash your hands often. Do not touch your eyes without washing your hands first.  Keep the sores clean and dry.  If told, put ice on the sores: ? Put ice in a plastic bag. ? Place a towel between your skin and the bag. ? Leave the ice on for 20 minutes, 2-3 times a day. Eating and drinking  Eat a soft, bland diet. Avoid eating hot, cold, or salty foods. These can hurt your mouth.  Use a straw if it hurts to drink out of a glass.  Eat foods that have a lot of lysine in them. These include meat, fish, and dairy products.  Avoid sugary foods, chocolates, nuts, and grains. These foods have a high amount of a substance (arginine) that can cause the virus to grow. Lifestyle  Do not kiss, have oral sex, or share personal items until your sores heal.  Stress, poor sleep, and being out  in the sun can trigger outbreaks. Make sure you: ? Do activities that help you relax, such as deep breathing exercises or meditation. ? Get enough sleep. ? Apply sunscreen on your lips before you go out in the sun. Contact a doctor if:  You have symptoms for more than 2 weeks.  You have pus coming from the sores.  You have redness that is spreading.  You have pain or irritation in your eye.  You get sores on your genitals.  Your sores do not heal within 2 weeks.  You get cold sores often. Get help right away if:  You have a fever and your symptoms suddenly get worse.  You have a headache and confusion.  You have tiredness (fatigue).  You do not want to eat as much as normal (loss of appetite).  You have a stiff neck or are sensitive to light. Summary  A cold sore is a small, fluid-filled sore that forms inside of the mouth or on the lips, gums, nose, chin, or cheeks.  Cold sores can spread from person to person (are contagious) until the sores crust over completely. Most cold sores go away within 2 weeks.  Wash your hands often. Do not touch your eyes without washing your hands first.  Do not kiss, have oral sex, or share personal items until your sores heal.  Contact a doctor if your sores do not heal within 2 weeks. This information is not intended to replace advice given to you by your health care provider. Make sure you discuss any questions you have with your health care provider. Document Revised: 02/25/2019 Document Reviewed: 04/07/2018 Elsevier Patient Education  2020 [employer].

## 2020-09-04 ENCOUNTER — Encounter: Payer: Self-pay | Admitting: Family Medicine

## 2020-09-08 ENCOUNTER — Ambulatory Visit (INDEPENDENT_AMBULATORY_CARE_PROVIDER_SITE_OTHER): Payer: 59 | Admitting: Psychology

## 2020-09-08 DIAGNOSIS — F411 Generalized anxiety disorder: Secondary | ICD-10-CM | POA: Diagnosis not present

## 2020-09-08 DIAGNOSIS — F331 Major depressive disorder, recurrent, moderate: Secondary | ICD-10-CM

## 2020-09-14 ENCOUNTER — Telehealth: Payer: Self-pay | Admitting: *Deleted

## 2020-09-14 ENCOUNTER — Other Ambulatory Visit: Payer: Self-pay | Admitting: Family Medicine

## 2020-09-14 MED ORDER — VALACYCLOVIR HCL 500 MG PO TABS
500.0000 mg | ORAL_TABLET | Freq: Every day | ORAL | 0 refills | Status: DC
Start: 2020-09-14 — End: 2020-09-14

## 2020-09-14 MED ORDER — VALACYCLOVIR HCL 500 MG PO TABS: 500.0000 mg | ORAL_TABLET | Freq: Every day | ORAL | 0 refills | Status: DC

## 2020-09-14 NOTE — Telephone Encounter (Signed)
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 Patient would like to know if Dr Sw[place]iland can up the dosage. [phone]

## 2020-09-14 NOTE — Addendum Note (Signed)
Addended by: Kathreen Devoid on: 09/14/2020 05:27 PM   Modules accepted: Orders

## 2020-09-14 NOTE — Telephone Encounter (Signed)
We can try suppression therapy, which means to take Valtrex 500 mg daily for 3 to 4 months. Prescription sent. Thanks, BJ

## 2020-09-14 NOTE — Telephone Encounter (Signed)
I spoke with pt. She is aware of message below. She will contact us before she runs out to let us know how she is doing.

## 2020-09-15 ENCOUNTER — Ambulatory Visit (INDEPENDENT_AMBULATORY_CARE_PROVIDER_SITE_OTHER): Payer: 59 | Admitting: Psychology

## 2020-09-15 DIAGNOSIS — F411 Generalized anxiety disorder: Secondary | ICD-10-CM | POA: Diagnosis not present

## 2020-09-15 DIAGNOSIS — F331 Major depressive disorder, recurrent, moderate: Secondary | ICD-10-CM | POA: Diagnosis not present

## 2020-09-29 ENCOUNTER — Ambulatory Visit (INDEPENDENT_AMBULATORY_CARE_PROVIDER_SITE_OTHER): Payer: 59 | Admitting: Psychology

## 2020-09-29 DIAGNOSIS — F331 Major depressive disorder, recurrent, moderate: Secondary | ICD-10-CM | POA: Diagnosis not present

## 2020-09-29 DIAGNOSIS — F411 Generalized anxiety disorder: Secondary | ICD-10-CM | POA: Diagnosis not present

## 2020-10-20 ENCOUNTER — Ambulatory Visit (INDEPENDENT_AMBULATORY_CARE_PROVIDER_SITE_OTHER): Payer: 59 | Admitting: Psychology

## 2020-10-20 DIAGNOSIS — F411 Generalized anxiety disorder: Secondary | ICD-10-CM | POA: Diagnosis not present

## 2020-10-20 DIAGNOSIS — F331 Major depressive disorder, recurrent, moderate: Secondary | ICD-10-CM | POA: Diagnosis not present

## 2020-11-02 ENCOUNTER — Ambulatory Visit (INDEPENDENT_AMBULATORY_CARE_PROVIDER_SITE_OTHER): Payer: 59 | Admitting: Psychology

## 2020-11-02 DIAGNOSIS — F331 Major depressive disorder, recurrent, moderate: Secondary | ICD-10-CM | POA: Diagnosis not present

## 2020-11-02 DIAGNOSIS — F411 Generalized anxiety disorder: Secondary | ICD-10-CM | POA: Diagnosis not present

## 2020-11-24 ENCOUNTER — Ambulatory Visit (INDEPENDENT_AMBULATORY_CARE_PROVIDER_SITE_OTHER): Payer: 59 | Admitting: Psychology

## 2020-11-24 DIAGNOSIS — F411 Generalized anxiety disorder: Secondary | ICD-10-CM

## 2020-11-24 DIAGNOSIS — F331 Major depressive disorder, recurrent, moderate: Secondary | ICD-10-CM

## 2020-12-01 ENCOUNTER — Ambulatory Visit (INDEPENDENT_AMBULATORY_CARE_PROVIDER_SITE_OTHER): Payer: 59 | Admitting: Psychology

## 2020-12-01 DIAGNOSIS — F331 Major depressive disorder, recurrent, moderate: Secondary | ICD-10-CM | POA: Diagnosis not present

## 2020-12-01 DIAGNOSIS — F411 Generalized anxiety disorder: Secondary | ICD-10-CM

## 2020-12-07 ENCOUNTER — Other Ambulatory Visit: Payer: Self-pay | Admitting: Family Medicine

## 2020-12-15 ENCOUNTER — Ambulatory Visit (INDEPENDENT_AMBULATORY_CARE_PROVIDER_SITE_OTHER): Payer: 59 | Admitting: Psychology

## 2020-12-15 DIAGNOSIS — F331 Major depressive disorder, recurrent, moderate: Secondary | ICD-10-CM | POA: Diagnosis not present

## 2020-12-15 DIAGNOSIS — F411 Generalized anxiety disorder: Secondary | ICD-10-CM

## 2020-12-22 ENCOUNTER — Ambulatory Visit (INDEPENDENT_AMBULATORY_CARE_PROVIDER_SITE_OTHER): Payer: 59 | Admitting: Psychology

## 2020-12-22 DIAGNOSIS — F411 Generalized anxiety disorder: Secondary | ICD-10-CM

## 2020-12-22 DIAGNOSIS — F331 Major depressive disorder, recurrent, moderate: Secondary | ICD-10-CM | POA: Diagnosis not present

## 2020-12-29 ENCOUNTER — Ambulatory Visit (INDEPENDENT_AMBULATORY_CARE_PROVIDER_SITE_OTHER): Payer: 59 | Admitting: Psychology

## 2020-12-29 DIAGNOSIS — F331 Major depressive disorder, recurrent, moderate: Secondary | ICD-10-CM | POA: Diagnosis not present

## 2020-12-29 DIAGNOSIS — F411 Generalized anxiety disorder: Secondary | ICD-10-CM

## 2021-01-02 ENCOUNTER — Encounter: Payer: Self-pay | Admitting: Family Medicine

## 2021-01-04 ENCOUNTER — Encounter: Payer: Self-pay | Admitting: Family Medicine

## 2021-01-04 ENCOUNTER — Ambulatory Visit: Payer: Managed Care, Other (non HMO) | Admitting: Family Medicine

## 2021-01-04 ENCOUNTER — Other Ambulatory Visit: Payer: Self-pay

## 2021-01-04 VITALS — BP 118/70 | HR 89 | Resp 12 | Ht 61.08 in | Wt 127.0 lb

## 2021-01-04 DIAGNOSIS — F419 Anxiety disorder, unspecified: Secondary | ICD-10-CM | POA: Diagnosis not present

## 2021-01-04 DIAGNOSIS — F332 Major depressive disorder, recurrent severe without psychotic features: Secondary | ICD-10-CM

## 2021-01-04 DIAGNOSIS — F320 Major depressive disorder, single episode, mild: Secondary | ICD-10-CM

## 2021-01-04 DIAGNOSIS — F909 Attention-deficit hyperactivity disorder, unspecified type: Secondary | ICD-10-CM

## 2021-01-04 MED ORDER — BUPROPION HCL ER (SR) 100 MG PO TB12: 100.0000 mg | ORAL_TABLET | Freq: Two times a day (BID) | ORAL | 2 refills | Status: DC

## 2021-01-04 NOTE — Progress Notes (Signed)
Chief Complaint  Patient presents with  . ADHD   HPI: Daisy White is a 20 y.o. female, who is here today concerned about ADHD. Her father and sister have hx of ADHD and on medication. She does not feel like her symptoms are as bad as her sister's. Her grades are not as good as they were when she first started, 1-2nd year. 3rd and current year she is really struggling. Hard for her to focus for more than 20 min. States that her mother has noted that she talks faster for the past 2 years,different "speech pattern." Adult ADHD Self Report Scale (most recent)    Adult ADHD Self-Report Scale (ASRS-v1.1) Symptom Checklist - 01/04/21 1641      Part A   1. How often do you have trouble wrapping up the final details of a project, once the challenging parts have been done? Very Often  2. How often do you have difficulty getting things done in order when you have to do a task that requires organization? Very Often    3. How often do you have problems remembering appointments or obligations? Sometimes  4. When you have a task that requires a lot of thought, how often do you avoid or delay getting started? Very Often    5. How often do you fidget or squirm with your hands or feet when you have to sit down for a long time? Very Often  6. How often do you feel overly active and compelled to do things, like you were driven by a motor? Very Often      Part B   7. How often do you make careless mistakes when you have to work on a boring or difficult project? Often  8. How often do you have difficulty keeping your attention when you are doing boring or repetitive work? Sometimes    9. How often do you have difficulty concentrating on what people say to you, even when they are speaking to you directly? Often  10. How often do you misplace or have difficulty finding things at home or at work? Very Often    11. How often are you distracted by activity or noise around you? Very Often  12. How often do you  leave your seat in meetings or other situations in which you are expected to remain seated? Never    13. How often do you feel restless or fidgety? Often  14. How often do you have difficulty unwinding and relaxing when you have time to yourself? Very Often    15. How often do you find yourself talking too much when you are in social situations? Very Often  16. When you are in a conversation, how often do you find yourself finishing the sentences of the people you are talking to, before they can finish them themselves? Very Often    17. How often do you have difficulty waiting your turn in situations when turn taking is required? Rarely  18. How often do you interrupt others when they are busy? Very Often          She has had some insomnia before, not getting worse. No FHx of bipolar disorder.  Anxiety and depression for some time now.  Depression worse since abortion in 02/2020. She has decided to tell her mother that it was not a miscarriage and this has helped. She is back home, living with her parents, broke up with her boyfriend in 10/2020. Her relation with her  father has improved.  Depression screen [facility] 2/9 01/04/2021 10/20/2019  Decreased Interest 2 0  Down, Depressed, Hopeless 3 0  PHQ - 2 Score 5 0  Altered sleeping 2 -  Tired, decreased energy 3 -  Change in appetite 1 -  Feeling bad or failure about yourself  3 -  Trouble concentrating 3 -  Moving slowly or fidgety/restless 3 -  Suicidal thoughts 2 -  PHQ-9 Score 22 -  Difficult doing work/chores Extremely dIfficult -   GAD 7 : Generalized Anxiety Score 01/04/2021  Nervous, Anxious, on Edge 3  Control/stop worrying 3  Worry too much - different things 3  Trouble relaxing 2  Restless 3  Easily annoyed or irritable 2  Afraid - awful might happen 3  Total GAD 7 Score 19  Anxiety Difficulty Very difficult   In 10/2020 she stopped OCP's, she thought it was causing anger issues, feels better.  Occasional thoughts about  being better off death. She has not attempt hurting herself and has no plans on doing so. She has had a couple of anxiety attacks.  She follows with therapist once per week.  Review of Systems  Constitutional: Positive for fatigue. Negative for activity change.  Respiratory: Negative for chest tightness, shortness of breath and wheezing.   Gastrointestinal: Negative for abdominal pain, diarrhea, nausea and vomiting.  Musculoskeletal: Negative for gait problem and myalgias.  Skin: Negative for rash.  Neurological: Negative for syncope, weakness and headaches.  Psychiatric/Behavioral: Negative for confusion, hallucinations and self-injury. The patient is nervous/anxious.   Rest see pertinent positives and negatives per HPI.  Current Outpatient Medications on File Prior to Visit  Medication Sig Dispense Refill  . valACYclovir (VALTREX) 500 MG tablet TAKE 1 TABLET BY MOUTH EVERY DAY 90 tablet 3   No current facility-administered medications on file prior to visit.   Past Medical History:  Diagnosis Date  . Medical history non-contributory    No Known Allergies  Social History   Socioeconomic History  . Marital status: Single    Spouse name: Not on file  . Number of children: Not on file  . Years of education: Not on file  . Highest education level: Not on file  Occupational History  . Not on file  Tobacco Use  . Smoking status: Never Smoker  . Smokeless tobacco: Never Used  Substance and Sexual Activity  . Alcohol use: No  . Drug use: No  . Sexual activity: Never  Other Topics Concern  . Not on file  Social History Narrative  . Not on file   Social Determinants of Health   Financial Resource Strain: Not on file  Food Insecurity: Not on file  Transportation Needs: Not on file  Physical Activity: Not on file  Stress: Not on file  Social Connections: Not on file   Vitals:   01/04/21 1553  BP: 118/70  Pulse: 89  Resp: 12  SpO2: 98%   Body mass index is 23.93  kg/m.  Physical Exam Vitals and nursing note reviewed.  Constitutional:      General: She is not in acute distress.    Appearance: She is well-developed.  HENT:     Head: Normocephalic and atraumatic.     Mouth/Throat:     Mouth: Oropharynx is clear and moist and mucous membranes are normal. Mucous membranes are moist.     Pharynx: Oropharynx is clear.  Eyes:     Extraocular Movements: EOM normal.     Conjunctiva/sclera: Conjunctivae normal.  Cardiovascular:     Rate and Rhythm: Normal rate and regular rhythm.     Heart sounds: No murmur heard.   Pulmonary:     Effort: Pulmonary effort is normal. No respiratory distress.     Breath sounds: Normal breath sounds.  Musculoskeletal:        General: No edema.  Skin:    General: Skin is warm.     Findings: No erythema.  Neurological:     General: No focal deficit present.     Mental Status: She is alert and oriented to person, place, and time.     Gait: Gait normal.     Deep Tendon Reflexes: Strength normal.  Psychiatric:        Mood and Affect: Mood is anxious. Affect is not labile.        Thought Content: Thought content does not include suicidal ideation. Thought content does not include suicidal plan.        Cognition and Memory: Cognition and memory normal.   ASSESSMENT AND PLAN:  Teah was seen today for adhd.  Diagnoses and all orders for this visit:  Attention deficit hyperactivity disorder (ADHD), unspecified ADHD type ASRS suggestive of ADHD. We discussed treatment options and some side effects. She would like something not very strong and for short period of time. Agrees with trying Wellbutrin. Non pharmacologic strategies to manage time will also help.  -     buPROPion (WELLBUTRIN SR) 100 MG 12 hr tablet; Take 1 tablet (100 mg total) by mouth 2 (two) times daily.  Anxiety disorder, unspecified type For now continue with CBT weekly. We can consider adding a SSRI next visit. Instructed about warning  signs.  Severe episode of recurrent major depressive disorder, without psychotic features (HCC) Today she is not suicidal. Instructed about warning signs. Wellbutrin started today, recommend 1 tab daily for 7-10 days then increase to whole tab if well tolerated. Continue weekly psychotherapy.  -     buPROPion (WELLBUTRIN SR) 100 MG 12 hr tablet; Take 1 tablet (100 mg total) by mouth 2 (two) times daily.  Return in about 6 weeks (around 02/15/2021) for anxiety,depression.   Jamen Loiseau G. [place], MD  [facility]. [place] office.   A few things to remember from today's visit:  Depression, major, single episode, mild (HCC) - Plan: buPROPion (WELLBUTRIN SR) 100 MG 12 hr tablet  Anxiety disorder, unspecified type  If you need refills please call your pharmacy. Do not use My Chart to request refills or for acute issues that need immediate attention. Today we started Wellbutrin to help with depression and possible add.  Start once daily and increase to 2 times in 7 days of well tolerated.  Please be sure medication list is accurate. If a new problem present, please set up appointment sooner than planned today.

## 2021-01-04 NOTE — Patient Instructions (Addendum)
A few things to remember from today's visit:  Depression, major, single episode, mild (HCC) - Plan: buPROPion (WELLBUTRIN SR) 100 MG 12 hr tablet  Anxiety disorder, unspecified type  If you need refills please call your pharmacy. Do not use My Chart to request refills or for acute issues that need immediate attention. Today we started Wellbutrin to help with depression and possible add.  Start once daily and increase to 2 times in 7 days of well tolerated.  Please be sure medication list is accurate. If a new problem present, please set up appointment sooner than planned today.

## 2021-01-05 ENCOUNTER — Ambulatory Visit (INDEPENDENT_AMBULATORY_CARE_PROVIDER_SITE_OTHER): Payer: 59 | Admitting: Psychology

## 2021-01-05 DIAGNOSIS — F331 Major depressive disorder, recurrent, moderate: Secondary | ICD-10-CM

## 2021-01-05 DIAGNOSIS — F411 Generalized anxiety disorder: Secondary | ICD-10-CM

## 2021-01-12 ENCOUNTER — Ambulatory Visit (INDEPENDENT_AMBULATORY_CARE_PROVIDER_SITE_OTHER): Payer: 59 | Admitting: Psychology

## 2021-01-12 DIAGNOSIS — F411 Generalized anxiety disorder: Secondary | ICD-10-CM

## 2021-01-12 DIAGNOSIS — F331 Major depressive disorder, recurrent, moderate: Secondary | ICD-10-CM

## 2021-01-19 ENCOUNTER — Ambulatory Visit (INDEPENDENT_AMBULATORY_CARE_PROVIDER_SITE_OTHER): Payer: 59 | Admitting: Psychology

## 2021-01-19 DIAGNOSIS — F411 Generalized anxiety disorder: Secondary | ICD-10-CM

## 2021-01-19 DIAGNOSIS — F331 Major depressive disorder, recurrent, moderate: Secondary | ICD-10-CM | POA: Diagnosis not present

## 2021-02-06 ENCOUNTER — Other Ambulatory Visit: Payer: Self-pay

## 2021-02-06 DIAGNOSIS — F332 Major depressive disorder, recurrent severe without psychotic features: Secondary | ICD-10-CM

## 2021-02-06 DIAGNOSIS — F909 Attention-deficit hyperactivity disorder, unspecified type: Secondary | ICD-10-CM

## 2021-02-06 MED ORDER — BUPROPION HCL ER (SR) 100 MG PO TB12: 100.0000 mg | ORAL_TABLET | Freq: Two times a day (BID) | ORAL | 2 refills | Status: DC

## 2021-02-09 ENCOUNTER — Ambulatory Visit: Payer: 59 | Admitting: Psychology

## 2021-02-16 ENCOUNTER — Other Ambulatory Visit: Payer: Self-pay

## 2021-02-17 ENCOUNTER — Ambulatory Visit (INDEPENDENT_AMBULATORY_CARE_PROVIDER_SITE_OTHER): Payer: Managed Care, Other (non HMO) | Admitting: Family Medicine

## 2021-02-17 ENCOUNTER — Encounter: Payer: Self-pay | Admitting: Family Medicine

## 2021-02-17 VITALS — BP 110/70 | HR 87 | Resp 12 | Ht 61.08 in | Wt 129.5 lb

## 2021-02-17 DIAGNOSIS — F419 Anxiety disorder, unspecified: Secondary | ICD-10-CM | POA: Diagnosis not present

## 2021-02-17 DIAGNOSIS — F320 Major depressive disorder, single episode, mild: Secondary | ICD-10-CM

## 2021-02-17 DIAGNOSIS — F909 Attention-deficit hyperactivity disorder, unspecified type: Secondary | ICD-10-CM | POA: Diagnosis not present

## 2021-02-17 NOTE — Progress Notes (Signed)
HPI: Daisy White is a 20 y.o. female, who is here today for follow up.   She was last seen on 01/04/21, when she was concerned about worsening anxiety,depression,and ADHD symptoms. Wellbutrin SR 100 mg bid was started. She has noted great improvement in depression symptoms. Anxiety initially mildly worse but better controlled now. She has also noted improvement in attention/concentration when she takes medication bid. Sometimes she forgets 2nd dose of Wellbutrin. Initially she had some palpitation and constipation, both resolved.  She is not longer seeing her therapist. She is seeing school counselor for now. She did not feel like her therapist was helping much. Feels comfortable with school counselor.  She is not longer living with her parents, back with her ex-boyfriend, has a good relation with him and he is supportive. She witnessed a big argument between her parents, she did not feel comfortable staying home. She feels like she has a better relation with her father when she is not living with him.  Excited about trip to [place] in May/2022.  Depression screen [facility] 2/9 02/17/2021 01/04/2021 10/20/2019  Decreased Interest 1 2 0  Down, Depressed, Hopeless 1 3 0  PHQ - 2 Score 2 5 0  Altered sleeping 2 2 -  Tired, decreased energy 1 3 -  Change in appetite 0 1 -  Feeling bad or failure about yourself  2 3 -  Trouble concentrating 2 3 -  Moving slowly or fidgety/restless 1 3 -  Suicidal thoughts 0 2 -  PHQ-9 Score 10 22 -  Difficult doing work/chores Somewhat difficult Extremely dIfficult -   GAD 7 : Generalized Anxiety Score 02/17/2021 01/04/2021  Nervous, Anxious, on Edge 1 3  Control/stop worrying 1 3  Worry too much - different things 2 3  Trouble relaxing 1 2  Restless 1 3  Easily annoyed or irritable 1 2  Afraid - awful might happen 1 3  Total GAD 7 Score 8 19  Anxiety Difficulty Somewhat difficult Very difficult   No other concerns today.  Review of Systems   Constitutional: Negative for activity change, appetite change, chills and fever.  HENT: Negative for nosebleeds and sore throat.   Gastrointestinal: Negative for abdominal pain, nausea and vomiting.  Musculoskeletal: Negative for gait problem and myalgias.  Neurological: Negative for syncope and headaches.  Psychiatric/Behavioral: Negative for confusion and hallucinations.  Rest of ROS, see pertinent positives sand negatives in HPI  Current Outpatient Medications on File Prior to Visit  Medication Sig Dispense Refill  . buPROPion (WELLBUTRIN SR) 100 MG 12 hr tablet Take 1 tablet (100 mg total) by mouth 2 (two) times daily. 180 tablet 2  . valACYclovir (VALTREX) 500 MG tablet TAKE 1 TABLET BY MOUTH EVERY DAY 90 tablet 3   No current facility-administered medications on file prior to visit.   Past Medical History:  Diagnosis Date  . Medical history non-contributory    No Known Allergies  Social History   Socioeconomic History  . Marital status: Single    Spouse name: Not on file  . Number of children: Not on file  . Years of education: Not on file  . Highest education level: Not on file  Occupational History  . Not on file  Tobacco Use  . Smoking status: Never Smoker  . Smokeless tobacco: Never Used  Substance and Sexual Activity  . Alcohol use: No  . Drug use: No  . Sexual activity: Never  Other Topics Concern  . Not on file  Social  History Narrative  . Not on file   Social Determinants of Health   Financial Resource Strain: Not on file  Food Insecurity: Not on file  Transportation Needs: Not on file  Physical Activity: Not on file  Stress: Not on file  Social Connections: Not on file    Vitals:   02/17/21 1418  BP: 110/70  Pulse: 87  Resp: 12  SpO2: 98%   Body mass index is 24.4 kg/m.  Physical Exam Vitals and nursing note reviewed.  Constitutional:      General: She is not in acute distress.    Appearance: She is well-developed.  HENT:     Head:  Normocephalic and atraumatic.     Mouth/Throat:     Mouth: Mucous membranes are moist.  Eyes:     Conjunctiva/sclera: Conjunctivae normal.  Cardiovascular:     Rate and Rhythm: Normal rate and regular rhythm.     Heart sounds: Murmur (SEM I/VI RUSB) heard.    Pulmonary:     Effort: Pulmonary effort is normal. No respiratory distress.     Breath sounds: Normal breath sounds.  Skin:    General: Skin is warm.     Findings: No erythema.  Neurological:     Mental Status: She is alert and oriented to person, place, and time.     Gait: Gait normal.  Psychiatric:        Speech: Speech normal.        Thought Content: Thought content does not include suicidal ideation. Thought content does not include suicidal plan.   ASSESSMENT AND PLAN:  Ms. Daisy White was seen today for follow-up.  Diagnoses and all orders for this visit:  Attention deficit hyperactivity disorder (ADHD), unspecified ADHD type Improved. We discussed other treatment options, prefers to hold on adding CNS stimulants for now.  Anxiety disorder, unspecified type Mild improvement. For now she prefers to hold on adding a SSRI. Continue CBT.  Depression, major, single episode, mild (HCC) Great improvement. Continue Wellbutrin SR 100 mg bid. Because planning on traveling overseas, she prefers not to make changes today. Instructed about warning signs.  Return in about 3 months (around 05/19/2021) for depression,anxiety,ADHD.  Bambie Pizzolato G. [place], MD  [facility]. [place] [facility].   Return in about 3 months (around 05/19/2021) for depression,anxiety,ADHD.  Tawan Degroote G. [place], MD  [facility]. [place] [facility].    A few things to remember from today's visit:  Anxiety disorder, unspecified type  Attention deficit hyperactivity disorder (ADHD), unspecified ADHD type  Depression, major, single episode, mild (HCC)  If you need refills please call your pharmacy. Do not use My Chart to request  refills or for acute issues that need immediate attention.   No changes today. I will see you back after coming back from [place].  Please be sure medication list is accurate. If a new problem present, please set up appointment sooner than planned today.

## 2021-02-17 NOTE — Patient Instructions (Signed)
A few things to remember from today's visit:  Anxiety disorder, unspecified type  Attention deficit hyperactivity disorder (ADHD), unspecified ADHD type  Depression, major, single episode, mild (HCC)  If you need refills please call your pharmacy. Do not use My Chart to request refills or for acute issues that need immediate attention.   No changes today. I will see you back after coming back from [place].  Please be sure medication list is accurate. If a new problem present, please set up appointment sooner than planned today.

## 2021-02-18 ENCOUNTER — Encounter: Payer: Self-pay | Admitting: Family Medicine

## 2021-03-01 ENCOUNTER — Encounter: Payer: Self-pay | Admitting: Family Medicine

## 2021-03-06 NOTE — Telephone Encounter (Signed)
Pt has appt Wednesday °

## 2021-03-08 ENCOUNTER — Ambulatory Visit (INDEPENDENT_AMBULATORY_CARE_PROVIDER_SITE_OTHER): Payer: Managed Care, Other (non HMO) | Admitting: Family Medicine

## 2021-03-08 ENCOUNTER — Other Ambulatory Visit: Payer: Self-pay

## 2021-03-08 VITALS — BP 118/70 | HR 84 | Temp 98.2°F | Resp 12 | Ht 61.0 in | Wt 125.0 lb

## 2021-03-08 DIAGNOSIS — N390 Urinary tract infection, site not specified: Secondary | ICD-10-CM

## 2021-03-08 DIAGNOSIS — R300 Dysuria: Secondary | ICD-10-CM

## 2021-03-08 LAB — POCT URINALYSIS DIPSTICK
Bilirubin, UA: NEGATIVE
Blood, UA: NEGATIVE
Glucose, UA: NEGATIVE
Ketones, UA: NEGATIVE
Leukocytes, UA: NEGATIVE
Nitrite, UA: NEGATIVE
Protein, UA: NEGATIVE
Spec Grav, UA: 1.02 (ref 1.010–1.025)
Urobilinogen, UA: 0.2 E.U./dL
pH, UA: 6.5 (ref 5.0–8.0)

## 2021-03-08 MED ORDER — NITROFURANTOIN MONOHYD MACRO 100 MG PO CAPS: 100.0000 mg | ORAL_CAPSULE | Freq: Two times a day (BID) | ORAL | 0 refills | Status: AC

## 2021-03-08 NOTE — Progress Notes (Signed)
Chief Complaint  Patient presents with  . Urinary Tract Infection   HPI: Daisy White is a 20 y.o. female, who is here today complaining of 2 weeks of intermittent urinary symptoms.  Dysuria: "Sometimes." Urinary frequency: Yes Urinary urgency: Yes Incontinence: Occasionally. Gross hematuria: Negative.  Negative for fever, chills, change in appetite, or unusual fatigue.  Abdominal pain: Pelvic cramps, which could be related with menstrual period. Not radiated. Nausea or vomiting: Negative. Abnormal vaginal bleeding or discharge: Negative.  LMP:03/02/21, 6 days late. She had a negative home pregnancy test done.  Sexual activity: Yes Hx of UTI: N/A  OTC medications for this problem: Cranberry pills, which helped. She also increased water intake.  Review of Systems  Constitutional: Negative for activity change, appetite change and unexpected weight change.  Gastrointestinal: Negative for abdominal distention.       Negative for changes in bowel habits.  Genitourinary: Negative for difficulty urinating, dyspareunia, flank pain and genital sores.  Musculoskeletal: Negative for back pain, gait problem and myalgias.  Neurological: Negative for syncope and weakness.  Psychiatric/Behavioral: Negative for confusion.  Rest see pertinent positives and negatives per HPI.  Current Outpatient Medications on File Prior to Visit  Medication Sig Dispense Refill  . buPROPion (WELLBUTRIN SR) 100 MG 12 hr tablet Take 1 tablet (100 mg total) by mouth 2 (two) times daily. 180 tablet 2  . valACYclovir (VALTREX) 500 MG tablet TAKE 1 TABLET BY MOUTH EVERY DAY 90 tablet 3   No current facility-administered medications on file prior to visit.     Past Medical History:  Diagnosis Date  . Medical history non-contributory    No Known Allergies  Social History   Socioeconomic History  . Marital status: Single    Spouse name: Not on file  . Number of children: Not on file  . Years of  education: Not on file  . Highest education level: Not on file  Occupational History  . Not on file  Tobacco Use  . Smoking status: Never Smoker  . Smokeless tobacco: Never Used  Substance and Sexual Activity  . Alcohol use: No  . Drug use: No  . Sexual activity: Never  Other Topics Concern  . Not on file  Social History Narrative  . Not on file   Social Determinants of Health   Financial Resource Strain: Not on file  Food Insecurity: Not on file  Transportation Needs: Not on file  Physical Activity: Not on file  Stress: Not on file  Social Connections: Not on file    Vitals:   03/08/21 1506  BP: 118/70  Pulse: 84  Resp: 12  Temp: 98.2 F (36.8 C)  SpO2: 98%   Body mass index is 23.62 kg/m.  Physical Exam Vitals and nursing note reviewed.  Constitutional:      General: She is not in acute distress.    Appearance: She is well-developed, well-groomed and normal weight.  HENT:     Head: Normocephalic and atraumatic.  Eyes:     Conjunctiva/sclera: Conjunctivae normal.  Cardiovascular:     Rate and Rhythm: Normal rate and regular rhythm.  Pulmonary:     Effort: Pulmonary effort is normal. No respiratory distress.     Breath sounds: Normal breath sounds.  Abdominal:     Palpations: Abdomen is soft. There is no mass.     Tenderness: There is no abdominal tenderness.  Skin:    General: Skin is warm.     Findings: No erythema.  Neurological:     Mental Status: She is alert and oriented to person, place, and time.   ASSESSMENT AND PLAN:  Daisy White was seen today for urinary tract infection.  Diagnoses and all orders for this visit:  Dysuria -     POCT urinalysis dipstick -     Culture, Urine; Future -     Culture, Urine  Urinary tract infection without hematuria, site unspecified -     nitrofurantoin, macrocrystal-monohydrate, (MACROBID) 100 MG capsule; Take 1 capsule (100 mg total) by mouth 2 (two) times daily for 5 days.   Urine dipstick  negative. Possible causes of reported urinary symptoms discussed, including non infectious. Ucx ordered.   Empiric abx treatment with macrobid started today . We will be tailored treatment according to Ucx results and susceptibility report.  Clearly instructed about warning signs. F/U if symptoms persist.  Deniss Wormley G. [place], MD  [facility]. [place] office.    A few things to remember from today's visit:   Dysuria - Plan: POCT urinalysis dipstick, Culture, Urine, Culture, Urine  Urinary tract infection without hematuria, site unspecified - Plan: nitrofurantoin, macrocrystal-monohydrate, (MACROBID) 100 MG capsule  If you need refills please call your pharmacy. Do not use My Chart to request refills or for acute issues that need immediate attention.   Today we will treat empirically with antibiotic, which we might need to change when urine culture comes back depending of bacteria susceptibility.  Seek immediate medical attention if severe abdominal pain, vomiting, fever/chills, or worsening symptoms. F/U if symptomatic are not any better after 2-3 days of antibiotic treatment.  Urinary Tract Infection, Adult A urinary tract infection (UTI) is an infection of any part of the urinary tract. The urinary tract includes:  The kidneys.  The ureters.  The bladder.  The urethra. These organs make, store, and get rid of pee (urine) in the body. What are the causes? This infection is caused by germs (bacteria) in your genital area. These germs grow and cause swelling (inflammation) of your urinary tract. What increases the risk? The following factors may make you more likely to develop this condition:  Using a small, thin tube (catheter) to drain pee.  Not being able to control when you pee or poop (incontinence).  Being female. If you are female, these things can increase the risk: ? Using these methods to prevent pregnancy:  A medicine that kills sperm  (spermicide).  A device that blocks sperm (diaphragm). ? Having low levels of a female hormone (estrogen). ? Being pregnant. You are more likely to develop this condition if:  You have genes that add to your risk.  You are sexually active.  You take antibiotic medicines.  You have trouble peeing because of: ? A prostate that is bigger than normal, if you are female. ? A blockage in the part of your body that drains pee from the bladder. ? A kidney stone. ? A nerve condition that affects your bladder. ? Not getting enough to drink. ? Not peeing often enough.  You have other conditions, such as: ? Diabetes. ? A weak disease-fighting system (immune system). ? Sickle cell disease. ? Gout. ? Injury of the spine. What are the signs or symptoms? Symptoms of this condition include:  Needing to pee right away.  Peeing small amounts often.  Pain or burning when peeing.  Blood in the pee.  Pee that smells bad or not like normal.  Trouble peeing.  Pee that is cloudy.  Fluid coming from  the vagina, if you are female.  Pain in the belly or lower back. Other symptoms include:  Vomiting.  Not feeling hungry.  Feeling mixed up (confused). This may be the first symptom in older adults.  Being tired and grouchy (irritable).  A fever.  Watery poop (diarrhea). How is this treated?  Taking antibiotic medicine.  Taking other medicines.  Drinking enough water. In some cases, you may need to see a specialist. Follow these instructions at home: Medicines  Take over-the-counter and prescription medicines only as told by your doctor.  If you were prescribed an antibiotic medicine, take it as told by your doctor. Do not stop taking it even if you start to feel better. General instructions  Make sure you: ? Pee until your bladder is empty. ? Do not hold pee for a long time. ? Empty your bladder after sex. ? Wipe from front to back after peeing or pooping if you are a  female. Use each tissue one time when you wipe.  Drink enough fluid to keep your pee pale yellow.  Keep all follow-up visits.   Contact a doctor if:  You do not get better after 1-2 days.  Your symptoms go away and then come back. Get help right away if:  You have very bad back pain.  You have very bad pain in your lower belly.  You have a fever.  You have chills.  You feeling like you will vomit or you vomit. Summary  A urinary tract infection (UTI) is an infection of any part of the urinary tract.  This condition is caused by germs in your genital area.  There are many risk factors for a UTI.  Treatment includes antibiotic medicines.  Drink enough fluid to keep your pee pale yellow. This information is not intended to replace advice given to you by your health care provider. Make sure you discuss any questions you have with your health care provider. Document Revised: 06/17/2020 Document Reviewed: 06/17/2020 Elsevier Patient Education  2021 Elsevier Inc.  Please be sure medication list is accurate. If a new problem present, please set up appointment sooner than planned today.

## 2021-03-08 NOTE — Patient Instructions (Addendum)
A few things to remember from today's visit:   Dysuria - Plan: POCT urinalysis dipstick, Culture, Urine, Culture, Urine  Urinary tract infection without hematuria, site unspecified - Plan: nitrofurantoin, macrocrystal-monohydrate, (MACROBID) 100 MG capsule  If you need refills please call your pharmacy. Do not use My Chart to request refills or for acute issues that need immediate attention.   Today we will treat empirically with antibiotic, which we might need to change when urine culture comes back depending of bacteria susceptibility.  Seek immediate medical attention if severe abdominal pain, vomiting, fever/chills, or worsening symptoms. F/U if symptomatic are not any better after 2-3 days of antibiotic treatment.  Urinary Tract Infection, Adult A urinary tract infection (UTI) is an infection of any part of the urinary tract. The urinary tract includes:  The kidneys.  The ureters.  The bladder.  The urethra. These organs make, store, and get rid of pee (urine) in the body. What are the causes? This infection is caused by germs (bacteria) in your genital area. These germs grow and cause swelling (inflammation) of your urinary tract. What increases the risk? The following factors may make you more likely to develop this condition:  Using a small, thin tube (catheter) to drain pee.  Not being able to control when you pee or poop (incontinence).  Being female. If you are female, these things can increase the risk: ? Using these methods to prevent pregnancy:  A medicine that kills sperm (spermicide).  A device that blocks sperm (diaphragm). ? Having low levels of a female hormone (estrogen). ? Being pregnant. You are more likely to develop this condition if:  You have genes that add to your risk.  You are sexually active.  You take antibiotic medicines.  You have trouble peeing because of: ? A prostate that is bigger than normal, if you are female. ? A blockage in  the part of your body that drains pee from the bladder. ? A kidney stone. ? A nerve condition that affects your bladder. ? Not getting enough to drink. ? Not peeing often enough.  You have other conditions, such as: ? Diabetes. ? A weak disease-fighting system (immune system). ? Sickle cell disease. ? Gout. ? Injury of the spine. What are the signs or symptoms? Symptoms of this condition include:  Needing to pee right away.  Peeing small amounts often.  Pain or burning when peeing.  Blood in the pee.  Pee that smells bad or not like normal.  Trouble peeing.  Pee that is cloudy.  Fluid coming from the vagina, if you are female.  Pain in the belly or lower back. Other symptoms include:  Vomiting.  Not feeling hungry.  Feeling mixed up (confused). This may be the first symptom in older adults.  Being tired and grouchy (irritable).  A fever.  Watery poop (diarrhea). How is this treated?  Taking antibiotic medicine.  Taking other medicines.  Drinking enough water. In some cases, you may need to see a specialist. Follow these instructions at home: Medicines  Take over-the-counter and prescription medicines only as told by your doctor.  If you were prescribed an antibiotic medicine, take it as told by your doctor. Do not stop taking it even if you start to feel better. General instructions  Make sure you: ? Pee until your bladder is empty. ? Do not hold pee for a long time. ? Empty your bladder after sex. ? Wipe from front to back after peeing or pooping if you are  a female. Use each tissue one time when you wipe.  Drink enough fluid to keep your pee pale yellow.  Keep all follow-up visits.   Contact a doctor if:  You do not get better after 1-2 days.  Your symptoms go away and then come back. Get help right away if:  You have very bad back pain.  You have very bad pain in your lower belly.  You have a fever.  You have chills.  You feeling  like you will vomit or you vomit. Summary  A urinary tract infection (UTI) is an infection of any part of the urinary tract.  This condition is caused by germs in your genital area.  There are many risk factors for a UTI.  Treatment includes antibiotic medicines.  Drink enough fluid to keep your pee pale yellow. This information is not intended to replace advice given to you by your health care provider. Make sure you discuss any questions you have with your health care provider. Document Revised: 06/17/2020 Document Reviewed: 06/17/2020 Elsevier Patient Education  2021 Elsevier Inc.  Please be sure medication list is accurate. If a new problem present, please set up appointment sooner than planned today.

## 2021-03-09 LAB — URINE CULTURE
MICRO NUMBER:: 11792608
Result:: NO GROWTH
SPECIMEN QUALITY:: ADEQUATE

## 2021-03-12 ENCOUNTER — Encounter: Payer: Self-pay | Admitting: Family Medicine

## 2021-03-24 ENCOUNTER — Telehealth: Payer: Self-pay | Admitting: Family Medicine

## 2021-03-24 NOTE — Telephone Encounter (Signed)
Pt is calling in stating that she is going to be studying abroad and would like to see if she can get a letter that explains her medications so that it is cleared through customs when she goes through.  Pt stated that she don't think that it has to be very detail but will need the name, how she uses it and what it is used for.  Pt is needing this letter composed by Monday 03/27/2021 and it can be release to her mychart.

## 2021-03-24 NOTE — Telephone Encounter (Signed)
The patient no longer needs the letter. She found something on MyChart that she is able to use.

## 2021-05-18 ENCOUNTER — Telehealth (INDEPENDENT_AMBULATORY_CARE_PROVIDER_SITE_OTHER): Payer: Self-pay | Admitting: Family Medicine

## 2021-05-18 ENCOUNTER — Encounter: Payer: Self-pay | Admitting: Family Medicine

## 2021-05-18 VITALS — Ht 61.0 in

## 2021-05-18 DIAGNOSIS — J302 Other seasonal allergic rhinitis: Secondary | ICD-10-CM

## 2021-05-18 DIAGNOSIS — R053 Chronic cough: Secondary | ICD-10-CM

## 2021-05-18 MED ORDER — FLUTICASONE PROPIONATE 50 MCG/ACT NA SUSP: 1.0000 | Freq: Two times a day (BID) | NASAL | 0 refills | Status: DC

## 2021-05-18 MED ORDER — BENZONATATE 100 MG PO CAPS: 200.0000 mg | ORAL_CAPSULE | Freq: Two times a day (BID) | ORAL | 0 refills | Status: AC | PRN

## 2021-05-18 NOTE — Progress Notes (Signed)
Virtual Visit via Video Note I connected with Daisy White on 05/18/21 by a video enabled telemedicine application and verified that I am speaking with the correct person using two identifiers.  Location patient: home Location provider:work office Persons participating in the virtual visit: patient, provider  I discussed the limitations of evaluation and management by telemedicine and the availability of in person appointments. The patient expressed understanding and agreed to proceed.  Chief Complaint  Patient presents with   Cough    Contracted in [place]. I have had a persistent cough for over a month now. I contracted it while I was abroad in [place] and it has come and gone over the month of May and June. It is causing me to have a sore throat, congestion, a dry and productive cough.   HPI: Daisy White is a 20 yo female with  of productive and nonproductive cough as described above. Problem is started a few weeks after arriving to [place]. Productive cough with clearish sputum, it seems to be worse in the morning. Negative for hemoptysis, abnormal weight loss, or night sweats.  She has not identified exacerbating or alleviating factors.  Sore throat exacerbated by swallowing and coughing, in the morning she has dry throat. Nasal congestion, rhinorrhea, and postnasal drainage. Negative for fever, chills, change in appetite, wheezing, stridor, dyspnea, heartburn, abdominal pain, nausea, vomiting, changes in bowel habits, body aches, urinary symptoms, or a skin rash. She has been taking DayQuil, which has helped.  While in his pain she has several COVID-19 test done, all negative. No sick contact, animal exposure, or insect bites. Symptoms seem to be stable.  She had similar symptoms in the past, 2020, after traveling to [place].  She states that at that time, her family thought she may have pneumonia, she was not treated with antibiotics, eventually symptoms subsided.  ROS: See pertinent positives  and negatives per HPI.  Past Medical History:  Diagnosis Date   Allergy    Anxiety    Depression    History reviewed. No pertinent surgical history.  Family History  Problem Relation Age of Onset   Anxiety disorder Mother    Hypertension Mother    Depression Mother    Hypertension Father    Anxiety disorder Sister    Depression Sister    ADD / ADHD Sister    Heart disease Maternal Grandmother    Diabetes Maternal Grandmother    Cancer Maternal Grandfather     Social History   Socioeconomic History   Marital status: Single    Spouse name: Not on file   Number of children: Not on file   Years of education: Not on file   Highest education level: Not on file  Occupational History   Not on file  Tobacco Use   Smoking status: Never   Smokeless tobacco: Never  Substance and Sexual Activity   Alcohol use: No   Drug use: No   Sexual activity: Yes  Other Topics Concern   Not on file  Social History Narrative   Not on file   Social Determinants of Health   Financial Resource Strain: Not on file  Food Insecurity: Not on file  Transportation Needs: Not on file  Physical Activity: Not on file  Stress: Not on file  Social Connections: Not on file  Intimate Partner Violence: Not on file   Current Outpatient Medications:    buPROPion (WELLBUTRIN SR) 100 MG 12 hr tablet, Take 1 tablet (100 mg total) by mouth 2 (two) times daily.,  Disp: 180 tablet, Rfl: 2   valACYclovir (VALTREX) 500 MG tablet, TAKE 1 TABLET BY MOUTH EVERY DAY, Disp: 90 tablet, Rfl: 3  EXAM:  VITALS per patient if applicable:Ht 5\' 1"  (1.549 m)   LMP 05/07/2021 (Exact Date)   BMI 23.62 kg/m   GENERAL: alert, oriented, appears well and in no acute distress  HEENT: atraumatic, conjunctiva clear, no obvious abnormalities on inspection of external nose and ears. Nasal voice/congestion. Mildly erythematous throat, no edema or exudate. Centered uvula.  NECK: normal movements of the head and  neck  LUNGS: on inspection no signs of respiratory distress, breathing rate appears normal, no obvious gross SOB, gasping or wheezing. Non productive cough a few times during visit.  CV: no obvious cyanosis  MS: moves all visible extremities without noticeable abnormality  PSYCH/NEURO: pleasant and cooperative, no obvious depression or anxiety, speech and thought processing grossly intact  ASSESSMENT AND PLAN:  Discussed the following assessment and plan:  Persistent cough for 3 weeks or longer - Plan: DG Chest 2 View, benzonatate (TESSALON) 100 MG capsule We discussed possible etiologies. History does not suggest a serious problem. Could be related with seasonal allergies. Because of persisting cough,CXR ordered. Instructed to monitor for new symptoms, instructed about warning signs.  Seasonal allergic rhinitis, unspecified trigger - Plan: fluticasone (FLONASE) 50 MCG/ACT nasal spray This probably could be contributing to her persistent cough. Recommend Flonase intranasal spray daily for a few weeks.  Son side effects discussed. Zyrtec 10 mg daily. Nasal saline irrigations as needed.  We discussed possible serious and likely etiologies, options for evaluation and workup, limitations of telemedicine visit vs in person visit, treatment, treatment risks and precautions.  I discussed the assessment and treatment plan with the patient. Daisy White was provided an opportunity to ask questions and all were answered. She agreed with the plan and demonstrated an understanding of the instructions.  Return if symptoms worsen or fail to improve.    Daisy White Maralyn Sago, MD

## 2021-07-12 ENCOUNTER — Encounter: Payer: Self-pay | Admitting: Family Medicine

## 2021-07-18 ENCOUNTER — Other Ambulatory Visit: Payer: Self-pay | Admitting: Family Medicine

## 2021-07-18 DIAGNOSIS — F909 Attention-deficit hyperactivity disorder, unspecified type: Secondary | ICD-10-CM

## 2021-07-18 DIAGNOSIS — F320 Major depressive disorder, single episode, mild: Secondary | ICD-10-CM

## 2021-07-18 MED ORDER — BUPROPION HCL ER (SR) 150 MG PO TB12: 150.0000 mg | ORAL_TABLET | Freq: Two times a day (BID) | ORAL | 1 refills | Status: DC

## 2021-09-25 ENCOUNTER — Other Ambulatory Visit: Payer: Self-pay | Admitting: Family Medicine

## 2021-09-25 DIAGNOSIS — F909 Attention-deficit hyperactivity disorder, unspecified type: Secondary | ICD-10-CM

## 2021-09-25 DIAGNOSIS — F320 Major depressive disorder, single episode, mild: Secondary | ICD-10-CM

## 2021-10-23 NOTE — Progress Notes (Signed)
Chief Complaint  Patient presents with   ear infection   HPI: Daisy White is a 20 y.o. female, who is here today complaining of 3 weeks of earache. Initially she had bilateral ear pressure and now it is just affecting her right ear.  Otalgia  There is pain in the right ear. This is a new problem. The problem has been unchanged. There has been no fever. The pain is mild. Associated symptoms include ear discharge. Pertinent negatives include no abdominal pain, coughing, diarrhea, headaches, hearing loss, neck pain, rash, rhinorrhea, sore throat or vomiting. She has tried nothing for the symptoms. There is no history of a chronic ear infection, hearing loss or a tympanostomy tube.  "Crusty" clear material in external ear. "Clogged" feeling. No recent URI or travel.  Depression and ADHD: She is on Wellbutrin SR 150 mg bid, dose was increased in 06/2021 from 100 mg bid. She has noted improvement in mood and concentration, able to complete assignments more efficiently and not easily distracted.  Insomnia for the past month, ascribed to stress at school.. Sleeping in average 6-7 hours. She had insomnia when she first started Wellbutrin but improved after a few weeks. She has been under a lot of stress. She is not longer doing CBT.  Review of Systems  Constitutional:  Negative for activity change, appetite change and chills.  HENT:  Positive for ear discharge and ear pain. Negative for hearing loss, mouth sores, nosebleeds, rhinorrhea, sore throat and tinnitus.   Eyes:  Negative for discharge and redness.  Respiratory:  Negative for cough and wheezing.   Gastrointestinal:  Negative for abdominal pain, diarrhea and vomiting.  Musculoskeletal:  Negative for myalgias and neck pain.  Skin:  Negative for rash.  Allergic/Immunologic: Positive for environmental allergies.  Neurological:  Negative for syncope and headaches.  Hematological:  Negative for adenopathy. Does not bruise/bleed easily.   Rest see pertinent positives and negatives per HPI.  Current Outpatient Medications on File Prior to Visit  Medication Sig Dispense Refill   valACYclovir (VALTREX) 500 MG tablet TAKE 1 TABLET BY MOUTH EVERY DAY 90 tablet 3   fluticasone (FLONASE) 50 MCG/ACT nasal spray Place 1 spray into both nostrils 2 (two) times daily. 16 g 0   No current facility-administered medications on file prior to visit.   Past Medical History:  Diagnosis Date   Allergy    Anxiety    Depression    No Known Allergies  Social History   Socioeconomic History   Marital status: Single    Spouse name: Not on file   Number of children: Not on file   Years of education: Not on file   Highest education level: Not on file  Occupational History   Not on file  Tobacco Use   Smoking status: Never   Smokeless tobacco: Never  Substance and Sexual Activity   Alcohol use: No   Drug use: No   Sexual activity: Yes  Other Topics Concern   Not on file  Social History Narrative   Not on file   Social Determinants of Health   Financial Resource Strain: Not on file  Food Insecurity: Not on file  Transportation Needs: Not on file  Physical Activity: Not on file  Stress: Not on file  Social Connections: Not on file   Vitals:   10/24/21 1032  BP: 118/78  Pulse: 88  Resp: 16  Temp: 98.6 F (37 C)  SpO2: 99%   Body mass index is 23.77 kg/m.  Physical Exam Vitals and nursing note reviewed.  Constitutional:      General: She is not in acute distress.    Appearance: Normal appearance. She is well-developed, well-groomed and normal weight. She is not ill-appearing.  HENT:     Head: Normocephalic and atraumatic.     Right Ear: Tympanic membrane, ear canal and external ear normal. Tenderness present. No middle ear effusion. There is no impacted cerumen. No mastoid tenderness. Tympanic membrane is not erythematous.     Left Ear: Tympanic membrane, ear canal and external ear normal.     Nose: No  congestion or rhinorrhea.     Right Sinus: No maxillary sinus tenderness or frontal sinus tenderness.     Left Sinus: No maxillary sinus tenderness or frontal sinus tenderness.     Mouth/Throat:     Mouth: Mucous membranes are moist.     Pharynx: Oropharynx is clear.  Eyes:     Conjunctiva/sclera: Conjunctivae normal.  Cardiovascular:     Rate and Rhythm: Normal rate and regular rhythm.     Heart sounds: No murmur heard. Pulmonary:     Effort: Pulmonary effort is normal. No respiratory distress.     Breath sounds: Normal breath sounds. No stridor.  Lymphadenopathy:     Head:     Right side of head: No submandibular adenopathy.     Left side of head: No submandibular adenopathy.     Cervical: No cervical adenopathy.  Skin:    General: Skin is warm.     Findings: No erythema or rash.  Neurological:     Mental Status: She is alert and oriented to person, place, and time.     Cranial Nerves: No cranial nerve deficit.     Gait: Gait normal.  Psychiatric:        Mood and Affect: Mood is not anxious or depressed.     Comments: Well groomed, good eye contact.   ASSESSMENT AND PLAN:  [name] was seen today for ear infection.  Diagnoses and all orders for this visit:  Acute otitis externa of right ear, unspecified type Mild. It does not seem to be infectious. ? Eczema, seborrheic dermatitis. Topical abx treatment x 7 days recommended.  Instructed about warning signs.  -     ciprofloxacin-dexamethasone (CIPRODEX) OTIC suspension; Place 4 drops into the right ear 2 (two) times daily for 7 days.  Attention deficit hyperactivity disorder (ADHD), unspecified ADHD type Problem is well controlled. Continue Wellbutrin SR 150 mg bid. We discussed some side effects.  -     buPROPion (WELLBUTRIN SR) 150 MG 12 hr tablet; Take 1 tablet (150 mg total) by mouth 2 (two) times daily.  Depression, major, single episode, mild (HCC) Stable. Continue Wellbutrin same dose. Recommend considering  resuming CBT.  -     buPROPion (WELLBUTRIN SR) 150 MG 12 hr tablet; Take 1 tablet (150 mg total) by mouth 2 (two) times daily.  Need for influenza vaccination -     Flu Vaccine QUAD 53mo+IM (Fluarix, Fluzone & Alfiuria Quad PF)  Return in about 6 months (around 04/24/2022).   Melisssa Donner G. [place], MD  [facility]. [place] office.

## 2021-10-24 ENCOUNTER — Ambulatory Visit (INDEPENDENT_AMBULATORY_CARE_PROVIDER_SITE_OTHER): Payer: Self-pay | Admitting: Family Medicine

## 2021-10-24 ENCOUNTER — Encounter: Payer: Self-pay | Admitting: Family Medicine

## 2021-10-24 VITALS — BP 118/78 | HR 88 | Temp 98.6°F | Resp 16 | Ht 61.0 in | Wt 125.8 lb

## 2021-10-24 DIAGNOSIS — H60501 Unspecified acute noninfective otitis externa, right ear: Secondary | ICD-10-CM

## 2021-10-24 DIAGNOSIS — Z23 Encounter for immunization: Secondary | ICD-10-CM

## 2021-10-24 DIAGNOSIS — F320 Major depressive disorder, single episode, mild: Secondary | ICD-10-CM

## 2021-10-24 DIAGNOSIS — F909 Attention-deficit hyperactivity disorder, unspecified type: Secondary | ICD-10-CM

## 2021-10-24 MED ORDER — BUPROPION HCL ER (SR) 150 MG PO TB12: 150.0000 mg | ORAL_TABLET | Freq: Two times a day (BID) | ORAL | 1 refills | Status: DC

## 2021-10-24 MED ORDER — CIPROFLOXACIN-DEXAMETHASONE 0.3-0.1 % OT SUSP: 4.0000 [drp] | Freq: Two times a day (BID) | OTIC | 0 refills | Status: AC

## 2021-10-24 NOTE — Patient Instructions (Addendum)
A few things to remember from today's visit:  Acute otitis externa of right ear, unspecified type  Depression, major, single episode, mild (HCC) - Plan: buPROPion (WELLBUTRIN SR) 150 MG 12 hr tablet  Attention deficit hyperactivity disorder (ADHD), unspecified ADHD type - Plan: buPROPion (WELLBUTRIN SR) 150 MG 12 hr tablet  If you need refills please call your pharmacy. Do not use My Chart to request refills or for acute issues that need immediate attention.   Otitis Externa Otitis externa is an infection of the outer ear canal. The outer ear canal is the area between the outside of the ear and the eardrum. Otitis externa is sometimes called swimmer's ear. What are the causes? Common causes of this condition include: Swimming in dirty water. Moisture in the ear. An injury to the inside of the ear. An object stuck in the ear. A cut or scrape on the outside of the ear or in the ear canal. What increases the risk? You are more likely to get this condition if you go swimming often. What are the signs or symptoms? Itching in the ear. This is often the first symptom. Swelling of the ear. Redness in the ear. Ear pain. The pain may get worse when you pull on your ear. Pus coming from the ear. How is this treated? This condition may be treated with: Antibiotic ear drops. These are often given for 10-14 days. Medicines to reduce itching and swelling. Follow these instructions at home: If you were prescribed antibiotic ear drops, use them as told by your doctor. Do not stop using them even if you start to feel better. Take over-the-counter and prescription medicines only as told by your doctor. Avoid getting water in your ears as told by your doctor. You may be told to avoid swimming or water sports for a few days. Keep all follow-up visits. How is this prevented? Keep your ears dry. Use the corner of a towel to dry your ears after you swim or bathe. Try not to scratch or put things in  your ear. Doing these things makes it easier for germs to grow in your ear. Avoid swimming in lakes, dirty water, or swimming pools that may not have the right amount of a chemical called chlorine. Contact a doctor if: You have a fever. Your ear is still red, swollen, or painful after 3 days. You still have pus coming from your ear after 3 days. Your redness, swelling, or pain gets worse. You have a very bad headache. Get help right away if: You have redness, swelling, and pain or tenderness behind your ear. Summary Otitis externa is an infection of the outer ear canal. Symptoms include pain, redness, and swelling of the ear. If you were prescribed antibiotic ear drops, use them as told by your doctor. Do not stop using them even if you start to feel better. Try not to scratch or put things in your ear. This information is not intended to replace advice given to you by your health care provider. Make sure you discuss any questions you have with your health care provider. Document Revised: 01/18/2021 Document Reviewed: 01/18/2021 Elsevier Patient Education  2022 Elsevier Inc.  Please be sure medication list is accurate. If a new problem present, please set up appointment sooner than planned today.

## 2022-04-04 NOTE — Progress Notes (Unsigned)
ACUTE VISIT Chief Complaint  Patient presents with   Cough    On & off since December. Productive, has tried Nyquil. Negative covid test. Congestion, sore throat, dry cough, pain in lungs from coughing.    HPI: Daisy White is a 21 y.o. female, who is here today complaining of intermittent episodes of mostly productive cough since 10/2022. Hx of allergies.She has nasal congestion,rhinorrhea,and post nasal drainage every winter. She takes OTC Xyzal 5 mg daily. No hx of asthma but her mother and younger sister do. Cough is more productive in the morning. She has not identified exacerbating or alleviating factors.  Cough This is a recurrent problem. The current episode started more than 1 month ago. The problem has been waxing and waning. The problem occurs every few hours. The cough is Productive of sputum. Associated symptoms include chest pain (with coughing spells), nasal congestion, postnasal drip, rhinorrhea, a sore throat and wheezing. Pertinent negatives include no chills, ear congestion, ear pain, eye redness, fever, headaches, heartburn, hemoptysis, myalgias, rash, shortness of breath, sweats or weight loss. The symptoms are aggravated by cold air and pollens. Treatments tried: Nyquil. The treatment provided no relief. Her past medical history is significant for environmental allergies. There is no history of asthma, bronchitis or pneumonia.  Sore throat for about a week but it also happens intermittently. "Little" wheezing sometimes.  Cold sores about 10 times per year, this year she has had 2 episodes, last one 2 weeks ago. She has not identified exacerbating factors. She takes Valtrex daily as needed.  Review of Systems  Constitutional:  Negative for chills, fever and weight loss.  HENT:  Positive for congestion, postnasal drip, rhinorrhea and sore throat. Negative for ear pain, facial swelling and mouth sores.   Eyes:  Negative for discharge and redness.  Respiratory:   Positive for cough and wheezing. Negative for hemoptysis and shortness of breath.   Cardiovascular:  Positive for chest pain (with coughing spells).  Gastrointestinal:  Negative for abdominal pain, heartburn, nausea and vomiting.  Musculoskeletal:  Negative for myalgias.  Skin:  Negative for rash.  Allergic/Immunologic: Positive for environmental allergies.  Neurological:  Negative for syncope, weakness and headaches.  Hematological:  Negative for adenopathy. Does not bruise/bleed easily.  Rest see pertinent positives and negatives per HPI.  Current Outpatient Medications on File Prior to Visit  Medication Sig Dispense Refill   buPROPion (WELLBUTRIN SR) 150 MG 12 hr tablet Take 1 tablet (150 mg total) by mouth 2 (two) times daily. 180 tablet 1   No current facility-administered medications on file prior to visit.   Past Medical History:  Diagnosis Date   Allergy    Anxiety    Depression    No Known Allergies  Social History   Socioeconomic History   Marital status: Single    Spouse name: Not on file   Number of children: Not on file   Years of education: Not on file   Highest education level: Not on file  Occupational History   Not on file  Tobacco Use   Smoking status: Never   Smokeless tobacco: Never  Substance and Sexual Activity   Alcohol use: No   Drug use: No   Sexual activity: Yes  Other Topics Concern   Not on file  Social History Narrative   Not on file   Social Determinants of Health   Financial Resource Strain: Not on file  Food Insecurity: Not on file  Transportation Needs: Not on file  Physical Activity: Not on file  Stress: Not on file  Social Connections: Not on file   Vitals:   04/06/22 0943  BP: 100/60  Pulse: 80  Resp: 16  Temp: 98.5 F (36.9 C)  SpO2: 99%   Body mass index is 24.61 kg/m.  Physical Exam Vitals and nursing note reviewed.  Constitutional:      General: She is not in acute distress.    Appearance: She is  well-developed. She is not ill-appearing.  HENT:     Head: Normocephalic and atraumatic.     Right Ear: Tympanic membrane, ear canal and external ear normal.     Left Ear: Tympanic membrane, ear canal and external ear normal.     Nose: Septal deviation and rhinorrhea present.     Right Turbinates: Enlarged.     Left Turbinates: Not enlarged.     Mouth/Throat:     Mouth: Mucous membranes are moist.     Pharynx: Oropharynx is clear. Uvula midline. No posterior oropharyngeal erythema.  Eyes:     Conjunctiva/sclera: Conjunctivae normal.  Cardiovascular:     Rate and Rhythm: Normal rate and regular rhythm.     Heart sounds: No murmur heard. Pulmonary:     Effort: Pulmonary effort is normal. No respiratory distress.     Breath sounds: Normal breath sounds. No stridor.  Abdominal:     Palpations: Abdomen is soft. There is no mass.     Tenderness: There is no abdominal tenderness.  Lymphadenopathy:     Cervical: No cervical adenopathy.  Skin:    General: Skin is warm.     Findings: No erythema or rash.  Neurological:     Mental Status: She is alert and oriented to person, place, and time.     Gait: Gait normal.  Psychiatric:        Mood and Affect: Mood is anxious.     Comments: Well groomed, good eye contact.   ASSESSMENT AND PLAN:  Roneshia was seen today for cough.  Diagnoses and all orders for this visit: Orders Placed This Encounter  Procedures   DG Chest 2 View   POC COVID-19   POC Influenza A/B   Mild intermittent reactive airway disease without complication Recommend albuterol inhaler 2 puff every 6 hours for 7 days and then as needed for cough and wheezing. Flovent started today, 2 puff twice daily. We discussed some side effects of the medication, recommend swishing after using Flovent.  -     fluticasone (FLOVENT HFA) 110 MCG/ACT inhaler; Inhale 2 puffs into the lungs 2 (two) times daily. -     albuterol (VENTOLIN HFA) 108 (90 Base) MCG/ACT inhaler; Inhale 2 puffs  into the lungs every 6 (six) hours as needed for wheezing or shortness of breath.  Chronic cough We discussed possible etiologies. Per protocol rapid COVID and flu were done before visit and both negative.  History does not suggest an infectious process at this time. Because probably has been going on for at least 6 months, chest x-ray ordered today.  Allergic rhinitis It seems to be worse around winter. Continue Xyzal 5 mg daily. Nasal saline irrigations as needed will also help.  Recurrent herpes labialis We discussed Dx,prognosis, and treatment options. She prefers Valtrex for a few days instead 2 g bid x1d as needed for acute exacerbations.So will start asap at the time of flared ups back to yeast 500 mg twice daily x3 days.  Return in about 5 weeks (around 05/11/2022).  [employer]  [place], [place]. [place] office.

## 2022-04-06 ENCOUNTER — Ambulatory Visit: Payer: Managed Care, Other (non HMO) | Admitting: Family Medicine

## 2022-04-06 ENCOUNTER — Ambulatory Visit (INDEPENDENT_AMBULATORY_CARE_PROVIDER_SITE_OTHER): Payer: Managed Care, Other (non HMO)

## 2022-04-06 ENCOUNTER — Encounter: Payer: Self-pay | Admitting: Family Medicine

## 2022-04-06 VITALS — BP 100/60 | HR 80 | Temp 98.5°F | Resp 16 | Ht 61.0 in | Wt 130.2 lb

## 2022-04-06 DIAGNOSIS — B001 Herpesviral vesicular dermatitis: Secondary | ICD-10-CM | POA: Diagnosis not present

## 2022-04-06 DIAGNOSIS — J3089 Other allergic rhinitis: Secondary | ICD-10-CM | POA: Diagnosis not present

## 2022-04-06 DIAGNOSIS — J309 Allergic rhinitis, unspecified: Secondary | ICD-10-CM | POA: Insufficient documentation

## 2022-04-06 DIAGNOSIS — R053 Chronic cough: Secondary | ICD-10-CM

## 2022-04-06 DIAGNOSIS — J4520 Mild intermittent asthma, uncomplicated: Secondary | ICD-10-CM | POA: Diagnosis not present

## 2022-04-06 LAB — POC COVID19 BINAXNOW: SARS Coronavirus 2 Ag: NEGATIVE

## 2022-04-06 LAB — POCT INFLUENZA A/B: Influenza A, POC: NEGATIVE

## 2022-04-06 MED ORDER — AEROCHAMBER PLUS FLO-VU MEDIUM MISC: 1.0000 | Freq: Once | 0 refills | Status: AC

## 2022-04-06 MED ORDER — VALACYCLOVIR HCL 500 MG PO TABS: ORAL_TABLET | ORAL | 0 refills | Status: DC

## 2022-04-06 MED ORDER — FLUTICASONE PROPIONATE HFA 110 MCG/ACT IN AERO: 2.0000 | INHALATION_SPRAY | Freq: Two times a day (BID) | RESPIRATORY_TRACT | 1 refills | Status: AC

## 2022-04-06 MED ORDER — ALBUTEROL SULFATE HFA 108 (90 BASE) MCG/ACT IN AERS: 2.0000 | INHALATION_SPRAY | Freq: Four times a day (QID) | RESPIRATORY_TRACT | 0 refills | Status: AC | PRN

## 2022-04-06 NOTE — Patient Instructions (Addendum)
A few things to remember from today's visit:   Chronic cough - Plan: POC COVID-19, POC Influenza A/B, DG Chest 2 View  Recurrent herpes labialis  If you need refills please call your pharmacy. Do not use My Chart to request refills or for acute issues that need immediate attention.   Albuterol inh 2 puff every 6 hours for a week then as needed for wheezing or shortness of breath.  Start Flovent inh 2 times daily, rinse mouth after use. Valtrex usually is 2 g 2 times daily for one day but you can take it for 3 days loser dose as you requested. I will see you back in 5 weeks.  Please be sure medication list is accurate. If a new problem present, please set up appointment sooner than planned today.

## 2022-04-06 NOTE — Assessment & Plan Note (Signed)
We discussed Dx,prognosis, and treatment options. She prefers Valtrex for a few days instead 2 g bid x1d as needed for acute exacerbations.So will start asap at the time of flared ups back to yeast 500 mg twice daily x3 days.

## 2022-04-06 NOTE — Assessment & Plan Note (Signed)
It seems to be worse around winter. Continue Xyzal 5 mg daily. Nasal saline irrigations as needed will also help.

## 2022-08-31 ENCOUNTER — Encounter: Payer: Self-pay | Admitting: Family Medicine

## 2022-08-31 ENCOUNTER — Ambulatory Visit (INDEPENDENT_AMBULATORY_CARE_PROVIDER_SITE_OTHER): Payer: Managed Care, Other (non HMO) | Admitting: Family Medicine

## 2022-08-31 VITALS — BP 120/88 | HR 77 | Temp 98.8°F | Resp 12 | Ht 61.0 in | Wt 139.2 lb

## 2022-08-31 DIAGNOSIS — J4520 Mild intermittent asthma, uncomplicated: Secondary | ICD-10-CM

## 2022-08-31 DIAGNOSIS — F332 Major depressive disorder, recurrent severe without psychotic features: Secondary | ICD-10-CM

## 2022-08-31 DIAGNOSIS — J45909 Unspecified asthma, uncomplicated: Secondary | ICD-10-CM | POA: Insufficient documentation

## 2022-08-31 DIAGNOSIS — B001 Herpesviral vesicular dermatitis: Secondary | ICD-10-CM | POA: Diagnosis not present

## 2022-08-31 DIAGNOSIS — F909 Attention-deficit hyperactivity disorder, unspecified type: Secondary | ICD-10-CM

## 2022-08-31 MED ORDER — VALACYCLOVIR HCL 500 MG PO TABS: ORAL_TABLET | ORAL | 1 refills | Status: DC

## 2022-08-31 MED ORDER — BUPROPION HCL ER (SR) 150 MG PO TB12: 150.0000 mg | ORAL_TABLET | Freq: Two times a day (BID) | ORAL | 0 refills | Status: DC

## 2022-08-31 NOTE — Progress Notes (Signed)
ACUTE VISIT Chief Complaint  Patient presents with   Medication Refill   HPI: Daisy White is a 21 y.o. female, who is here today complaining of worsening depression. She was last seen on 04/06/22. She reports stopping Bupropion in the middle of July/2023 due to concerns about decreased libido.  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 She has been seeing a therapist since July/2023, who suggested an ADHD evaluation and recommended seeing a psychiatrist. She is interested in resuming Bupropion, which she was taken SR 150 mg bid to treat depression and ADHD. Sleeping approximately six hours per night due to stress and school demands.   Labial herpes: She needs a refill on Valtrex. She has been experiencing infrequent episodes of herpes and requests but would like to have medication in case she needs it. Last episode occurred about a month ago, with the previous one being three to four months prior.   Asthma:She states that she has been taking vitamin C and vitamin D supplements hoping to help her immune system. Since last visit she has been using Albuterol inhaler as needed, which they report has been helpful.She does not use Albuterol inh frequent, does not need refills. She is exercising exercising regularly and trying to maintain a healthy lifestyle.  She would like vit D check, no known hx of vit D deficiency.  Review of Systems  Constitutional:  Positive for fatigue. Negative for activity change and appetite change.  Respiratory:  Negative for cough, shortness of breath and wheezing.   Cardiovascular:  Negative for chest pain and palpitations.  Gastrointestinal:  Negative for abdominal pain, nausea and vomiting.  Endocrine: Negative for cold intolerance and heat intolerance.  Skin:  Negative for rash.  Allergic/Immunologic: Positive for  environmental allergies.  Psychiatric/Behavioral:  Negative for confusion, hallucinations and suicidal ideas.   Rest see pertinent positives and negatives per HPI.  Current Outpatient Medications on File Prior to Visit  Medication Sig Dispense Refill   albuterol (VENTOLIN HFA) 108 (90 Base) MCG/ACT inhaler Inhale 2 puffs into the lungs every 6 (six) hours as needed for wheezing or shortness of breath. 8 g 0   fluticasone (FLOVENT HFA) 110 MCG/ACT inhaler Inhale 2 puffs into the lungs 2 (two) times daily. 1 each 1   No current facility-administered medications on file prior to visit.   Past Medical History:  Diagnosis Date   Allergy    Anxiety    Depression    No Known Allergies  Social History   Socioeconomic History   Marital status: Single    Spouse name: Not on file   Number of children: Not on file   Years of education: Not on file   Highest education level: Not on file  Occupational History   Not on file  Tobacco Use   Smoking status: Never   Smokeless tobacco: Never  Substance and Sexual Activity   Alcohol use: No   Drug use: No   Sexual activity: Yes  Other Topics Concern   Not on file  Social History Narrative   Not on file   Social Determinants of Health   Financial Resource Strain: Not on file  Food Insecurity: Not on file  Transportation Needs: Not on file  Physical Activity: Not on file  Stress: Not on file  Social Connections: Not on file   Vitals:   08/31/22 1625  BP: 120/88  Pulse: 77  Resp: 12  Temp: 98.8 F (37.1 C)  SpO2: 99%   Body mass index is 26.31 kg/m.  Physical Exam Vitals and nursing note reviewed.  Constitutional:      General: She is not in acute distress.    Appearance: She is well-developed.  HENT:     Head: Normocephalic and atraumatic.  Eyes:     Conjunctiva/sclera: Conjunctivae normal.  Cardiovascular:     Rate and Rhythm: Normal rate and regular rhythm.     Heart sounds: Murmur (Soft SEM RUSB) heard.   Pulmonary:     Effort: Pulmonary effort is normal. No respiratory distress.     Breath sounds: Normal breath sounds.  Abdominal:     Palpations: Abdomen is soft. There is no mass.     Tenderness: There is no abdominal tenderness.  Skin:    General: Skin is warm.     Findings: No erythema or rash.  Neurological:     General: No focal deficit present.     Mental Status: She is alert and oriented to person, place, and time.     Gait: Gait normal.  Psychiatric:        Mood and Affect: Mood normal.        Speech: Speech normal.        Thought Content: Thought content does not include suicidal ideation. Thought content does not include suicidal plan.   ASSESSMENT AND PLAN:  [name] was seen today for medication refill.  Diagnoses and all orders for this visit:  Recurrent herpes labialis Continue Valtrex 500 mg bid x 3 days to start as needed for acute exacerbations. Rx sent.  Major depressive disorder, recurrent episode, severe (HCC) Stopped Wellbutrin in 05/2022, symptoms have worsened. She feels like mediation was helping and would like to resume it. Wellbutrin SR 150 mg sent to take bid. She would like to establish with psychiatrist, a list of providers in the area with contact information given. Instructed about warning signs.  Attention deficit hyperactivity disorder (ADHD) Wellbutrin SR 150 mg bid was helping with symptoms, medication resumed today. She is planning on establishing with psychiatrist.  Reactive airway disease Continue Albuterol inh 2 puff qid prn. Monitor for new symptoms. F/U as needed.  In regard to vit D, explained that her health insurance may not be covered by her health insurance, she prefers to hold on labs for now.  Return if symptoms worsen or fail to improve.  Jasara Corrigan G. Martin[place], MD  [facility] [place] office.

## 2022-08-31 NOTE — Assessment & Plan Note (Signed)
Wellbutrin SR 150 mg bid was helping with symptoms, medication resumed today. She is planning on establishing with psychiatrist.

## 2022-08-31 NOTE — Assessment & Plan Note (Signed)
Stopped Wellbutrin in 05/2022, symptoms have worsened. She feels like mediation was helping and would like to resume it. Wellbutrin SR 150 mg sent to take bid. She would like to establish with psychiatrist, a list of providers in the area with contact information given. Instructed about warning signs.

## 2022-08-31 NOTE — Patient Instructions (Addendum)
A few things to remember from today's visit:  Severe episode of recurrent major depressive disorder, without psychotic features ([place])  Attention deficit hyperactivity disorder (ADHD), unspecified ADHD type - Plan: buPROPion (WELLBUTRIN SR) 150 MG 12 hr tablet  Recurrent herpes labialis - Plan: valACYclovir (VALTREX) 500 MG tablet  Wellbutrin sent to resume at the same dose. If you want vit D check, stop supplementation for 2-3 months, then it can be checked, health insurance may not cover lab.  Establish with psychiatrist, list of providers given. Monitor your sleep and try to maintain a healthy sleep schedule, aiming for at least 7-8 hours per night. Continue exercising and maintaining a healthy lifestyle.  If you need refills for medications you take chronically, please call your pharmacy. Do not use My Chart to request refills or for acute issues that need immediate attention. If you send a my chart message, it may take a few days to be addressed, specially if I am not in the office.  Please be sure medication list is accurate. If a new problem present, please set up appointment sooner than planned today.

## 2022-08-31 NOTE — Assessment & Plan Note (Signed)
Continue Albuterol inh 2 puff qid prn. Monitor for new symptoms. F/U as needed.

## 2022-08-31 NOTE — Assessment & Plan Note (Signed)
Continue Valtrex 500 mg bid x 3 days to start as needed for acute exacerbations. Rx sent.

## 2022-10-18 ENCOUNTER — Encounter: Payer: Self-pay | Admitting: Family Medicine

## 2022-10-18 DIAGNOSIS — B001 Herpesviral vesicular dermatitis: Secondary | ICD-10-CM

## 2022-10-19 MED ORDER — VALACYCLOVIR HCL 500 MG PO TABS: ORAL_TABLET | ORAL | 3 refills | Status: DC

## 2022-10-30 ENCOUNTER — Encounter: Payer: Self-pay | Admitting: Family Medicine

## 2022-11-05 ENCOUNTER — Other Ambulatory Visit: Payer: Self-pay | Admitting: Family Medicine

## 2022-11-05 DIAGNOSIS — B001 Herpesviral vesicular dermatitis: Secondary | ICD-10-CM

## 2022-11-05 MED ORDER — VALACYCLOVIR HCL 500 MG PO TABS: 500.0000 mg | ORAL_TABLET | Freq: Every day | ORAL | 0 refills | Status: AC

## 2022-11-26 ENCOUNTER — Encounter: Payer: Self-pay | Admitting: Family Medicine

## 2022-11-26 ENCOUNTER — Ambulatory Visit (INDEPENDENT_AMBULATORY_CARE_PROVIDER_SITE_OTHER): Payer: Self-pay | Admitting: Family Medicine

## 2022-11-26 VITALS — BP 120/70 | HR 96 | Temp 98.4°F | Resp 12 | Ht 61.0 in | Wt 137.2 lb

## 2022-11-26 DIAGNOSIS — N390 Urinary tract infection, site not specified: Secondary | ICD-10-CM

## 2022-11-26 DIAGNOSIS — R300 Dysuria: Secondary | ICD-10-CM

## 2022-11-26 DIAGNOSIS — F332 Major depressive disorder, recurrent severe without psychotic features: Secondary | ICD-10-CM

## 2022-11-26 DIAGNOSIS — F909 Attention-deficit hyperactivity disorder, unspecified type: Secondary | ICD-10-CM

## 2022-11-26 DIAGNOSIS — R319 Hematuria, unspecified: Secondary | ICD-10-CM

## 2022-11-26 DIAGNOSIS — Z309 Encounter for contraceptive management, unspecified: Secondary | ICD-10-CM

## 2022-11-26 LAB — POCT URINALYSIS DIPSTICK
Bilirubin, UA: NEGATIVE
Blood, UA: POSITIVE
Glucose, UA: NEGATIVE
Ketones, UA: NEGATIVE
Nitrite, UA: NEGATIVE
Protein, UA: NEGATIVE
Spec Grav, UA: 1.015 (ref 1.010–1.025)
Urobilinogen, UA: 0.2 E.U./dL
pH, UA: 6 (ref 5.0–8.0)

## 2022-11-26 LAB — POCT URINE PREGNANCY: Preg Test, Ur: NEGATIVE

## 2022-11-26 MED ORDER — NORGESTIMATE-ETH ESTRADIOL 0.25-35 MG-MCG PO TABS: 1.0000 | ORAL_TABLET | Freq: Every day | ORAL | 2 refills | Status: DC

## 2022-11-26 MED ORDER — NITROFURANTOIN MONOHYD MACRO 100 MG PO CAPS: 100.0000 mg | ORAL_CAPSULE | Freq: Two times a day (BID) | ORAL | 0 refills | Status: AC

## 2022-11-26 NOTE — Patient Instructions (Signed)
A few things to remember from today's visit:  Dysuria - Plan: POCT urinalysis dipstick, Culture, Urine, Culture, Urine  Encounter for contraceptive management, unspecified type - Plan: Ambulatory referral to Gynecology, norgestimate-ethinyl estradiol (ORTHO-CYCLEN) 0.25-35 MG-MCG tablet  Urinary tract infection with hematuria, site unspecified - Plan: nitrofurantoin, macrocrystal-monohydrate, (MACROBID) 100 MG capsule  Adequate fluid intake, avoid holding urine for long hours, and over the counter Vit C OR cranberry capsules might help.  Today we will treat empirically with antibiotic, which we might need to change when urine culture comes back depending of bacteria susceptibility.  Seek immediate medical attention if severe abdominal pain, vomiting, fever/chills, or worsening symptoms. F/U if symptomatic are not any better after 2-3 days of antibiotic treatment.  If you need refills for medications you take chronically, please call your pharmacy. Do not use My Chart to request refills or for acute issues that need immediate attention. If you send a my chart message, it may take a few days to be addressed, specially if I am not in the office.  Please be sure medication list is accurate. If a new problem present, please set up appointment sooner than planned today.

## 2022-11-26 NOTE — Assessment & Plan Note (Signed)
Stable. Continue Wellbutrin SR 150 mg bid. She will continue following with psychiatrist, she has appt scheduled.

## 2022-11-26 NOTE — Progress Notes (Signed)
ACUTE VISIT Chief Complaint  Patient presents with   Urinary Tract Infection    Symptoms started on Thursday, pain with urination.    HPI: Daisy White is a 22 y.o. female, who is here today complaining of 4 days of urinary symptoms as described above. She noted some pinkish urine and lower back pain. No hx of nephrolithiasis.  Dysuria  This is a new problem. The current episode started in the past 7 days. The problem has been unchanged. The quality of the pain is described as burning. The pain is mild. Associated symptoms include frequency, hematuria and urgency. Pertinent negatives include no chills, discharge, flank pain, hesitancy, nausea, possible pregnancy, sweats or vomiting.  She has been taking cranberry supplements as an over-the-counter remedy. She admits to not drinking enough fluids prior to the onset of symptoms.  She is currently sexually active with a new partner. She would like to start contraception, took oral contraceptives but stopped in 2021 due to mood changes and depression but since she is now on Wellbutrin, would like to try again. LMP 11/06/22.  She started taking Wellbutrin (bupropion) in 2022, she is on SR 150 mg bid, which she believes has helped with her depression. However, she still reports feeling down, depressed sometimes. She was overwhelmed with final school assignments last year.  Wellbutrin has also help with ADHD.    11/26/2022    9:39 AM 04/06/2022   10:04 AM 10/24/2021   10:35 AM 02/18/2021    5:53 PM 01/04/2021    4:43 PM  Depression screen PHQ 2/9  Decreased Interest 1 1 1 1 2   Down, Depressed, Hopeless 2 1 2 1 3   PHQ - 2 Score 3 2 3 2 5   Altered sleeping 3 2 3 2 2   Tired, decreased energy 3 3 3 1 3   Change in appetite 0 0 1 0 1  Feeling bad or failure about yourself  1 1 1 2 3   Trouble concentrating 2 1 1 2 3   Moving slowly or fidgety/restless 3 2 2 1 3   Suicidal thoughts 0 0 0 0 2  PHQ-9 Score 15 11 14 10 22   Difficult doing  work/chores Very difficult Somewhat difficult  Somewhat difficult Extremely dIfficult   She is currently seeing a therapist every other week and has an appointment with a psychiatrist on January 26th. Her therapist has recommended testing for ADHD, so she can apply for school accommodations.  Review of Systems  Constitutional:  Negative for chills.  Respiratory:  Negative for cough and shortness of breath.   Gastrointestinal:  Negative for abdominal pain, nausea and vomiting.  Genitourinary:  Positive for dysuria, frequency, hematuria and urgency. Negative for flank pain and hesitancy.  Neurological:  Negative for syncope and weakness.  Psychiatric/Behavioral:  Negative for confusion and hallucinations.   See other pertinent positives and negatives in HPI.  Current Outpatient Medications on File Prior to Visit  Medication Sig Dispense Refill   albuterol (VENTOLIN HFA) 108 (90 Base) MCG/ACT inhaler Inhale 2 puffs into the lungs every 6 (six) hours as needed for wheezing or shortness of breath. 8 g 0   buPROPion (WELLBUTRIN SR) 150 MG 12 hr tablet Take 1 tablet (150 mg total) by mouth 2 (two) times daily. 180 tablet 0   fluticasone (FLOVENT HFA) 110 MCG/ACT inhaler Inhale 2 puffs into the lungs 2 (two) times daily. 1 each 1   valACYclovir (VALTREX) 500 MG tablet Take 1 tablet (500 mg total) by mouth daily. 1  tab bid for 3 days as needed with flare ups, start asap. 90 tablet 0   No current facility-administered medications on file prior to visit.    Past Medical History:  Diagnosis Date   Allergy    Anxiety    Depression    No Known Allergies  Social History   Socioeconomic History   Marital status: Single    Spouse name: Not on file   Number of children: Not on file   Years of education: Not on file   Highest education level: Not on file  Occupational History   Not on file  Tobacco Use   Smoking status: Never   Smokeless tobacco: Never  Substance and Sexual Activity    Alcohol use: No   Drug use: No   Sexual activity: Yes  Other Topics Concern   Not on file  Social History Narrative   Not on file   Social Determinants of Health   Financial Resource Strain: Not on file  Food Insecurity: Not on file  Transportation Needs: Not on file  Physical Activity: Not on file  Stress: Not on file  Social Connections: Not on file   Vitals:   11/26/22 0923  BP: 120/70  Pulse: 96  Resp: 12  Temp: 98.4 F (36.9 C)  SpO2: 97%  Body mass index is 25.93 kg/m.  Physical Exam Vitals and nursing note reviewed.  Constitutional:      General: She is not in acute distress.    Appearance: She is well-developed.  HENT:     Head: Normocephalic and atraumatic.  Eyes:     Conjunctiva/sclera: Conjunctivae normal.  Cardiovascular:     Rate and Rhythm: Normal rate and regular rhythm.  Pulmonary:     Effort: Pulmonary effort is normal. No respiratory distress.     Breath sounds: Normal breath sounds.  Abdominal:     Palpations: Abdomen is soft. There is no mass.     Tenderness: There is no abdominal tenderness. There is no right CVA tenderness or left CVA tenderness.  Skin:    General: Skin is warm.     Findings: No erythema.  Neurological:     Mental Status: She is alert and oriented to person, place, and time.  Psychiatric:        Mood and Affect: Mood and affect normal.   ASSESSMENT AND PLAN:  Daisy White was seen today for urinary tract infection.  Diagnoses and all orders for this visit:  Dysuria We discussed possible etiologies. Urine dipstick today positive for blood and leukocytes (3+).  Encounter for contraceptive management, unspecified type Urine pregnancy test negative. We discussed options. She would like to try OCP's, some side effects discussed. Referral to gyn for gyn preventive care and to discuss other birth control options in case she does not tolerate OCP's.  -     norgestimate-ethinyl estradiol (ORTHO-CYCLEN) 0.25-35 MG-MCG tablet;  Take 1 tablet by mouth daily. -     POCT urine pregnancy  Urinary tract infection with hematuria, site unspecified Empiric abx treatment with Macrobid started today, will tailor according to Ucx results and susceptibility report. Increase fluid intake. Continue cranberry pills. Clearly instructed about warning signs.  -     nitrofurantoin, macrocrystal-monohydrate, (MACROBID) 100 MG capsule; Take 1 capsule (100 mg total) by mouth 2 (two) times daily for 5 days.  Attention deficit hyperactivity disorder (ADHD) Wellbutrin SR 150 mg bid is helping with symptoms. She has appt with psychiatrist to be formally screened.  Major depressive disorder, recurrent  episode, severe ([place]) Stable. Continue Wellbutrin SR 150 mg bid. She will continue following with psychiatrist, she has appt scheduled.  Return if symptoms worsen or fail to improve, for keep next appointment.  Lucas Exline G. Martinique, MD  [facility] [place] office.

## 2022-11-26 NOTE — Assessment & Plan Note (Signed)
Wellbutrin SR 150 mg bid is helping with symptoms. She has appt with psychiatrist to be formally screened.

## 2022-11-28 LAB — URINE CULTURE
MICRO NUMBER:: 14401905
SPECIMEN QUALITY:: ADEQUATE

## 2022-12-24 ENCOUNTER — Ambulatory Visit: Payer: Managed Care, Other (non HMO) | Admitting: Nurse Practitioner

## 2022-12-24 ENCOUNTER — Other Ambulatory Visit (HOSPITAL_COMMUNITY)
Admission: RE | Admit: 2022-12-24 | Discharge: 2022-12-24 | Disposition: A | Discharge status: Home / Self Care | Payer: Managed Care, Other (non HMO) | Source: Ambulatory Visit | Attending: Nurse Practitioner | Admitting: Nurse Practitioner

## 2022-12-24 ENCOUNTER — Encounter: Payer: Self-pay | Admitting: Nurse Practitioner

## 2022-12-24 VITALS — BP 118/78 | Ht 62.0 in | Wt 136.0 lb

## 2022-12-24 DIAGNOSIS — Z01419 Encounter for gynecological examination (general) (routine) without abnormal findings: Secondary | ICD-10-CM

## 2022-12-24 DIAGNOSIS — Z113 Encounter for screening for infections with a predominantly sexual mode of transmission: Secondary | ICD-10-CM | POA: Insufficient documentation

## 2022-12-24 DIAGNOSIS — Z3009 Encounter for other general counseling and advice on contraception: Secondary | ICD-10-CM

## 2022-12-24 DIAGNOSIS — Z30011 Encounter for initial prescription of contraceptive pills: Secondary | ICD-10-CM

## 2022-12-24 DIAGNOSIS — Z8349 Family history of other endocrine, nutritional and metabolic diseases: Secondary | ICD-10-CM | POA: Diagnosis not present

## 2022-12-24 DIAGNOSIS — Z23 Encounter for immunization: Secondary | ICD-10-CM

## 2022-12-24 MED ORDER — NORETHINDRONE 0.35 MG PO TABS: 1.0000 | ORAL_TABLET | Freq: Every day | ORAL | 3 refills | Status: DC

## 2022-12-24 NOTE — Progress Notes (Signed)
Daisy White 08-08-2001 [id number]   History:  22 y.o. G1P0010 presents as new patient to establish care. Monthly cycles. Wants to start on birth control. Was provided COCs by PCP but she is nervous to start because she did not tolerate COCs in the past due to mood changes. She had just had an abortion prior to starting COCs back then, so she was experiencing situational depression at that time. Seeing psychiatrist, changing up medications now. Has seen some improvement on Wellbutrin. Would like STD screening today. HSV, takes valtrex as needed. Has not had Gardasil and wants to start series today.   Gynecologic History Patient's last menstrual period was 12/05/2022 (exact date). Period Cycle (Days): 28 Period Duration (Days): 5 Period Pattern: Regular Menstrual Flow: Moderate Menstrual Control: Maxi pad, Tampon Dysmenorrhea: (!) Mild Dysmenorrhea Symptoms: Cramping Contraception/Family planning: none Sexually active: Yes, female and female  Health Maintenance Last Pap: Never Last mammogram: Not indicated Last colonoscopy: Not indicated Last Dexa: Not indicated   Past medical history, past surgical history, family history and social history were all reviewed and documented in the EPIC chart. 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   ROS:  A ROS was performed and pertinent positives and negatives are included.  Exam:  Vitals:   12/24/22 0944  BP: 118/78  Weight: 136 lb (61.7 kg)  Height: 5\' 2"  (1.575 m)   Body mass index is 24.87 kg/m.  General appearance:  Normal Thyroid:  Symmetrical, normal in size, without palpable masses or nodularity. Respiratory  Auscultation:  Clear without wheezing or rhonchi Cardiovascular  Auscultation:  Regular rate, without rubs, murmurs or gallops  Edema/varicosities:  Not grossly evident Abdominal  Soft,nontender, without masses, guarding or rebound.  Liver/spleen:  No organomegaly noted  Hernia:  None appreciated  Skin  Inspection:  Grossly  normal Breasts: Not indicated per guidelines Genitourinary   Inguinal/mons:  Normal without inguinal adenopathy  External genitalia:  Normal appearing vulva with no masses, tenderness, or lesions  BUS/Urethra/Skene's glands:  Normal  Vagina:  Normal appearing with normal color and discharge, no lesions  Cervix:  Normal appearing without discharge or lesions  Uterus:  Normal in size, shape and contour.  Midline and mobile, nontender  Adnexa/parametria:     Rt: Normal in size, without masses or tenderness.   Lt: Normal in size, without masses or tenderness.  Anus and perineum: Normal  Digital rectal exam: Not indicated  Patient informed chaperone available to be present for breast and pelvic exam. Patient has requested no chaperone to be present. Patient has been advised what will be completed during breast and pelvic exam.   Assessment/Plan:  22 y.o. G1P0010 to establish care.   Well female exam with routine gynecological exam - Plan: Cytology - PAP( [place]), CBC with Differential/Platelet, Comprehensive metabolic panel. Education provided on SBEs, importance of preventative screenings, current guidelines, high calcium diet, regular exercise, and multivitamin daily.   Screening examination for STD (sexually transmitted disease) - Plan: RPR, HIV Antibody (routine testing w rflx), Cytology - PAP( [place]). GC/CT/trich added to pap.   General counseling and advice on female contraception - Contraceptive options were reviewed, including hormonal methods, both combination (pill, patch, vaginal ring) and progesterone-only (pill, Depo Provera and Nexplanon), intrauterine devices (Mirena, [place], [place], and [place]), Phexxi, barrier methods (condoms, diaphragm) and female/female sterilization. The mechanisms, risks, benefits and side effects of all methods were discussed. She wants to try POPs.   Family history of thyroid disease - Plan: TSH. Mother's side.  Encounter for initial  prescription of contraceptive pills - Plan: norethindrone (ORTHO MICRONOR) 0.35 MG tablet daily. Aware of proper use. Will start with next menses.   Need for HPV vaccine - Plan: HPV 9-valent vaccine,Recombinate. Return in 2 months for second dose.   Screening for cervical cancer - Initial pap today.   Return in 1 year for annual.     Tamela Gammon DNP, 10:23 AM 12/24/2022

## 2022-12-25 LAB — CBC WITH DIFFERENTIAL/PLATELET
Absolute Monocytes: 699 cells/uL (ref 200–950)
Basophils Absolute: 53 cells/uL (ref 0–200)
Basophils Relative: 0.7 %
Eosinophils Absolute: 129 cells/uL (ref 15–500)
Eosinophils Relative: 1.7 %
HCT: 40.8 % (ref 35.0–45.0)
Hemoglobin: 13.7 g/dL (ref 11.7–15.5)
Lymphs Abs: 2181 cells/uL (ref 850–3900)
MCH: 29.8 pg (ref 27.0–33.0)
MCHC: 33.6 g/dL (ref 32.0–36.0)
MCV: 88.7 fL (ref 80.0–100.0)
MPV: 11.8 fL (ref 7.5–12.5)
Monocytes Relative: 9.2 %
Neutro Abs: 4537 cells/uL (ref 1500–7800)
Neutrophils Relative %: 59.7 %
Platelets: 205 10*3/uL (ref 140–400)
RBC: 4.6 10*6/uL (ref 3.80–5.10)
RDW: 13.3 % (ref 11.0–15.0)
Total Lymphocyte: 28.7 %
WBC: 7.6 10*3/uL (ref 3.8–10.8)

## 2022-12-25 LAB — COMPREHENSIVE METABOLIC PANEL
AG Ratio: 1.5 (calc) (ref 1.0–2.5)
ALT: 17 U/L (ref 6–29)
AST: 16 U/L (ref 10–30)
Albumin: 4.4 g/dL (ref 3.6–5.1)
Alkaline phosphatase (APISO): 48 U/L (ref 31–125)
BUN/Creatinine Ratio: 14 (calc) (ref 6–22)
BUN: 14 mg/dL (ref 7–25)
CO2: 27 mmol/L (ref 20–32)
Calcium: 10 mg/dL (ref 8.6–10.2)
Chloride: 104 mmol/L (ref 98–110)
Creat: 1.03 mg/dL — ABNORMAL HIGH (ref 0.50–0.96)
Globulin: 2.9 g/dL (calc) (ref 1.9–3.7)
Glucose, Bld: 61 mg/dL — ABNORMAL LOW (ref 65–99)
Potassium: 5.5 mmol/L — ABNORMAL HIGH (ref 3.5–5.3)
Sodium: 140 mmol/L (ref 135–146)
Total Bilirubin: 0.9 mg/dL (ref 0.2–1.2)
Total Protein: 7.3 g/dL (ref 6.1–8.1)

## 2022-12-25 LAB — HIV ANTIBODY (ROUTINE TESTING W REFLEX): HIV 1&2 Ab, 4th Generation: NONREACTIVE

## 2022-12-25 LAB — RPR: RPR Ser Ql: NONREACTIVE

## 2022-12-25 LAB — TSH: TSH: 1.66 mIU/L

## 2022-12-26 LAB — CYTOLOGY - PAP
Chlamydia: NEGATIVE
Comment: NEGATIVE
Comment: NEGATIVE
Comment: NORMAL
Diagnosis: NEGATIVE
Neisseria Gonorrhea: NEGATIVE
Trichomonas: NEGATIVE

## 2023-01-09 ENCOUNTER — Ambulatory Visit: Payer: Managed Care, Other (non HMO) | Admitting: Nurse Practitioner

## 2023-03-06 ENCOUNTER — Encounter: Payer: Self-pay | Admitting: Family Medicine

## 2023-03-06 MED ORDER — VALACYCLOVIR HCL 500 MG PO TABS: ORAL_TABLET | ORAL | 2 refills | Status: DC

## 2023-03-29 ENCOUNTER — Ambulatory Visit: Payer: Managed Care, Other (non HMO) | Admitting: Family Medicine

## 2023-03-29 ENCOUNTER — Encounter: Payer: Self-pay | Admitting: Family Medicine

## 2023-03-29 VITALS — BP 110/70 | HR 76 | Temp 98.1°F | Resp 12 | Ht 62.0 in | Wt 130.5 lb

## 2023-03-29 DIAGNOSIS — R300 Dysuria: Secondary | ICD-10-CM

## 2023-03-29 DIAGNOSIS — R319 Hematuria, unspecified: Secondary | ICD-10-CM | POA: Diagnosis not present

## 2023-03-29 DIAGNOSIS — B001 Herpesviral vesicular dermatitis: Secondary | ICD-10-CM

## 2023-03-29 DIAGNOSIS — N390 Urinary tract infection, site not specified: Secondary | ICD-10-CM | POA: Diagnosis not present

## 2023-03-29 LAB — POC URINALSYSI DIPSTICK (AUTOMATED)
Bilirubin, UA: NEGATIVE
Blood, UA: POSITIVE
Glucose, UA: NEGATIVE
Ketones, UA: NEGATIVE
Nitrite, UA: NEGATIVE
Protein, UA: POSITIVE — AB
Spec Grav, UA: 1.02 (ref 1.010–1.025)
Urobilinogen, UA: 0.2 E.U./dL
pH, UA: 6 (ref 5.0–8.0)

## 2023-03-29 MED ORDER — SULFAMETHOXAZOLE-TRIMETHOPRIM 800-160 MG PO TABS: 1.0000 | ORAL_TABLET | Freq: Two times a day (BID) | ORAL | 0 refills | Status: AC

## 2023-03-29 MED ORDER — NITROFURANTOIN MACROCRYSTAL 50 MG PO CAPS: ORAL_CAPSULE | ORAL | 1 refills | Status: AC

## 2023-03-29 NOTE — Patient Instructions (Addendum)
A few things to remember from today's visit:  Dysuria - Plan: POCT Urinalysis Dipstick (Automated), Culture, Urine, Culture, Urine  If you need refills for medications you take chronically, please call your pharmacy. Do not use My Chart to request refills or for acute issues that need immediate attention. If you send a my chart message, it may take a few days to be addressed, specially if I am not in the office.  Please be sure medication list is accurate. If a new problem present, please set up appointment sooner than planned today.

## 2023-03-29 NOTE — Progress Notes (Unsigned)
ACUTE VISIT Chief Complaint  Patient presents with   Urinary Tract Infection    Had intercourse Tuesday, symptoms started yesterday.    HPI: [name] is a 22 y.o. female, who is here today complaining of urinary symptoms. Gross hematuria, frequent urination, and dysuria, which she describes as burning. She reports that these symptoms started last night.  Suprapubic pressure sensation.  Dysuria  This is a new problem. The current episode started yesterday. The problem has been unchanged. The quality of the pain is described as burning. The pain is mild. There has been no fever. She is Sexually active. There is No history of pyelonephritis. Associated symptoms include frequency, hematuria and urgency. Pertinent negatives include no discharge, flank pain, hesitancy, nausea, possible pregnancy, sweats or vomiting. She has tried increased fluids for the symptoms.   She mentions that she is currently on birth control, started 2 weeks ago by her gynecologist, so has had some current vaginal spotting. States that she has been using tampons.  She is sexually active, having had intercourse most recently on Tuesday, which she feels might contribute to her to urinary symptoms. Last time she had a UTI was 11/2022 also after sex intercourse.  She also mentions that she is currently taking Wellbutrin XL 300 mg for depression, she follows with psychiatrist.  Recurrent cold sores with valacyclovir, which she takes daily during Summer, periods of sun exposure seem to exacerbate problem, as well as stress. The rest of the year she takes medication as needed.  Review of Systems  Constitutional:  Negative for activity change, appetite change and fatigue.  HENT:  Negative for nosebleeds and sore throat.   Gastrointestinal:  Negative for nausea and vomiting.  Genitourinary:  Positive for dysuria, frequency, hematuria and urgency. Negative for flank pain and hesitancy.  Musculoskeletal:  Negative for  arthralgias.  Skin:  Negative for rash.  Neurological:  Negative for syncope and weakness.  See other pertinent positives and negatives in HPI.  Current Outpatient Medications on File Prior to Visit  Medication Sig Dispense Refill   albuterol (VENTOLIN HFA) 108 (90 Base) MCG/ACT inhaler Inhale 2 puffs into the lungs every 6 (six) hours as needed for wheezing or shortness of breath. 8 g 0   fluticasone (FLOVENT HFA) 110 MCG/ACT inhaler Inhale 2 puffs into the lungs 2 (two) times daily. 1 each 1   norethindrone (ORTHO MICRONOR) 0.35 MG tablet Take 1 tablet (0.35 mg total) by mouth daily. 84 tablet 3   valACYclovir (VALTREX) 500 MG tablet Take 1 tablet (500 mg total) by mouth daily. 1 tab bid for 3 days as needed with flare ups, start asap. 90 tablet 2   No current facility-administered medications on file prior to visit.   Past Medical History:  Diagnosis Date   Allergy    Anxiety    Depression    Herpes    No Known Allergies  Social History   Socioeconomic History   Marital status: Single    Spouse name: Not on file   Number of children: Not on file   Years of education: Not on file   Highest education level: Not on file  Occupational History   Not on file  Tobacco Use   Smoking status: Never   Smokeless tobacco: Never  Substance and Sexual Activity   Alcohol use: Yes   Drug use: No   Sexual activity: Yes    Partners: Male    Birth control/protection: Condom  Other Topics Concern   Not  on file  Social History Narrative   Not on file   Social Determinants of Health   Financial Resource Strain: Not on file  Food Insecurity: Not on file  Transportation Needs: Not on file  Physical Activity: Not on file  Stress: Not on file  Social Connections: Not on file    Vitals:   03/29/23 1442  BP: 110/70  Pulse: 76  Resp: 12  Temp: 98.1 F (36.7 C)  SpO2: 98%   Body mass index is 23.87 kg/m.  Physical Exam Vitals and nursing note reviewed.  Constitutional:       General: She is not in acute distress.    Appearance: She is well-developed.  HENT:     Head: Normocephalic and atraumatic.     Mouth/Throat:     Lips: No lesions.     Mouth: Mucous membranes are moist.     Pharynx: Oropharynx is clear.  Eyes:     Conjunctiva/sclera: Conjunctivae normal.  Cardiovascular:     Rate and Rhythm: Normal rate and regular rhythm.     Heart sounds: Murmur (Soft SEM RUSB) heard.  Pulmonary:     Effort: Pulmonary effort is normal. No respiratory distress.     Breath sounds: Normal breath sounds.  Abdominal:     Palpations: Abdomen is soft. There is no mass.     Tenderness: There is no abdominal tenderness. There is no right CVA tenderness or left CVA tenderness.  Skin:    General: Skin is warm.     Findings: No erythema.  Neurological:     Mental Status: She is alert and oriented to person, place, and time.   ASSESSMENT AND PLAN:  Urinary tract infection with hematuria, site unspecified Empiric abx treatment with Bactrim DS started today, will tailor treatment according to Ucx results and susceptibility report. Continue adequate hydration. Clearly instructed about warning signs.  -     Sulfamethoxazole-Trimethoprim; Take 1 tablet by mouth 2 (two) times daily for 3 days.  Dispense: 6 tablet; Refill: 0  Dysuria Urine dipstick abnormal. We will follow Ucx.  -     POCT Urinalysis Dipstick (Automated) -     Urine Culture; Future  Recurrent herpes labialis Assessment & Plan: Continue taking Valtrex as needed.  Taking Valtrex 500 mg daily during hot weather, Summer, which seems to aggravate problem.  Postcoital UTI Assessment & Plan: We discussed treatment options. After reviewing some side effects, she would like to try Nitrofurantoin 50 mg after sex intercourse.  Orders: -     Nitrofurantoin Macrocrystal; 1 cap daily as needed after sex intercourse.  Dispense: 30 capsule; Refill: 1  Return if symptoms worsen or fail to improve.  Obediah Welles G.  [place], MD  [facility]. [place] office.

## 2023-03-29 NOTE — Assessment & Plan Note (Addendum)
Continue taking Valtrex as needed.  Taking Valtrex 500 mg daily during hot weather, Summer, which seems to aggravate problem.

## 2023-03-30 NOTE — Assessment & Plan Note (Signed)
We discussed treatment options. After reviewing some side effects, she would like to try Nitrofurantoin 50 mg after sex intercourse.

## 2023-03-31 LAB — URINE CULTURE
MICRO NUMBER:: 14941284
SPECIMEN QUALITY:: ADEQUATE

## 2023-04-23 IMAGING — DX DG CHEST 2V
2 series · 2 of 2 positions shown · non-contrast
Comparison: None Available.

CLINICAL DATA: Chronic cough. Persistent episodes of cough
intermittently for at least 6 months.

EXAM:
CHEST - 2 VIEW

[chest pa]
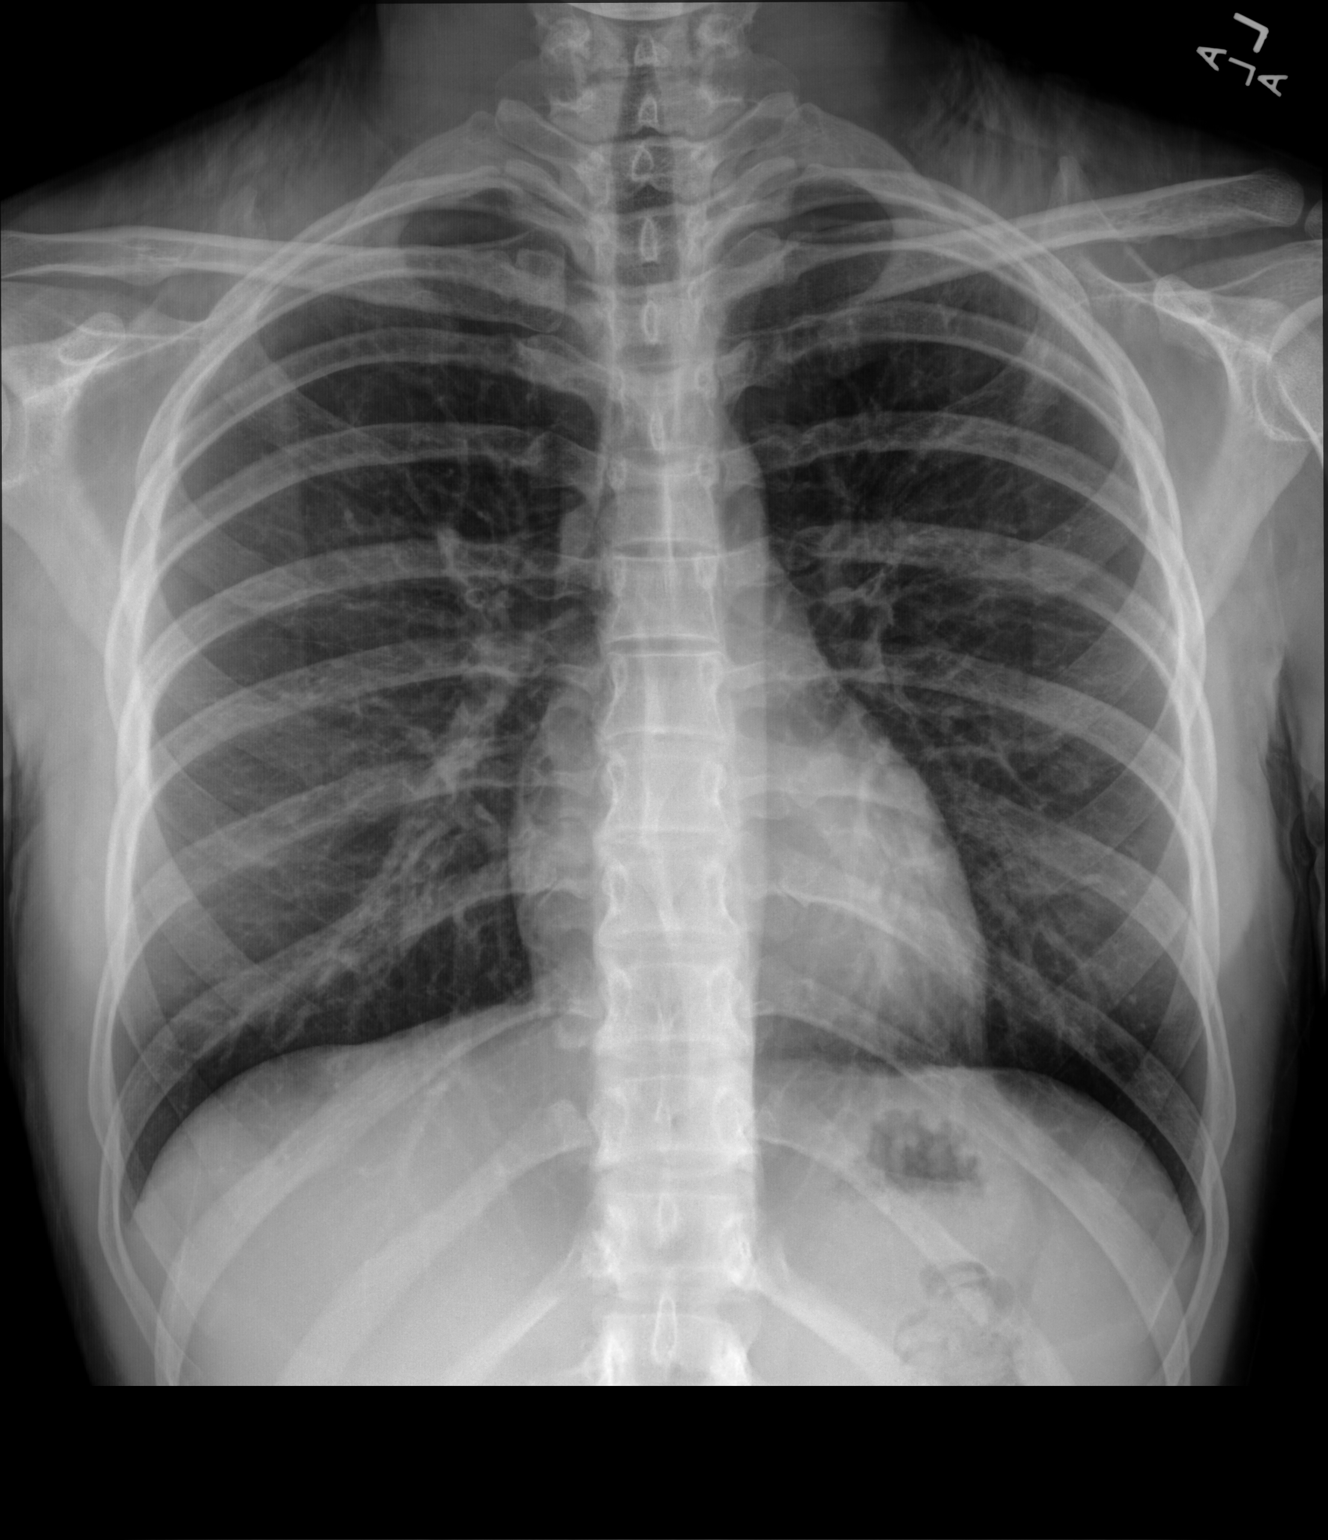

[chest lat]
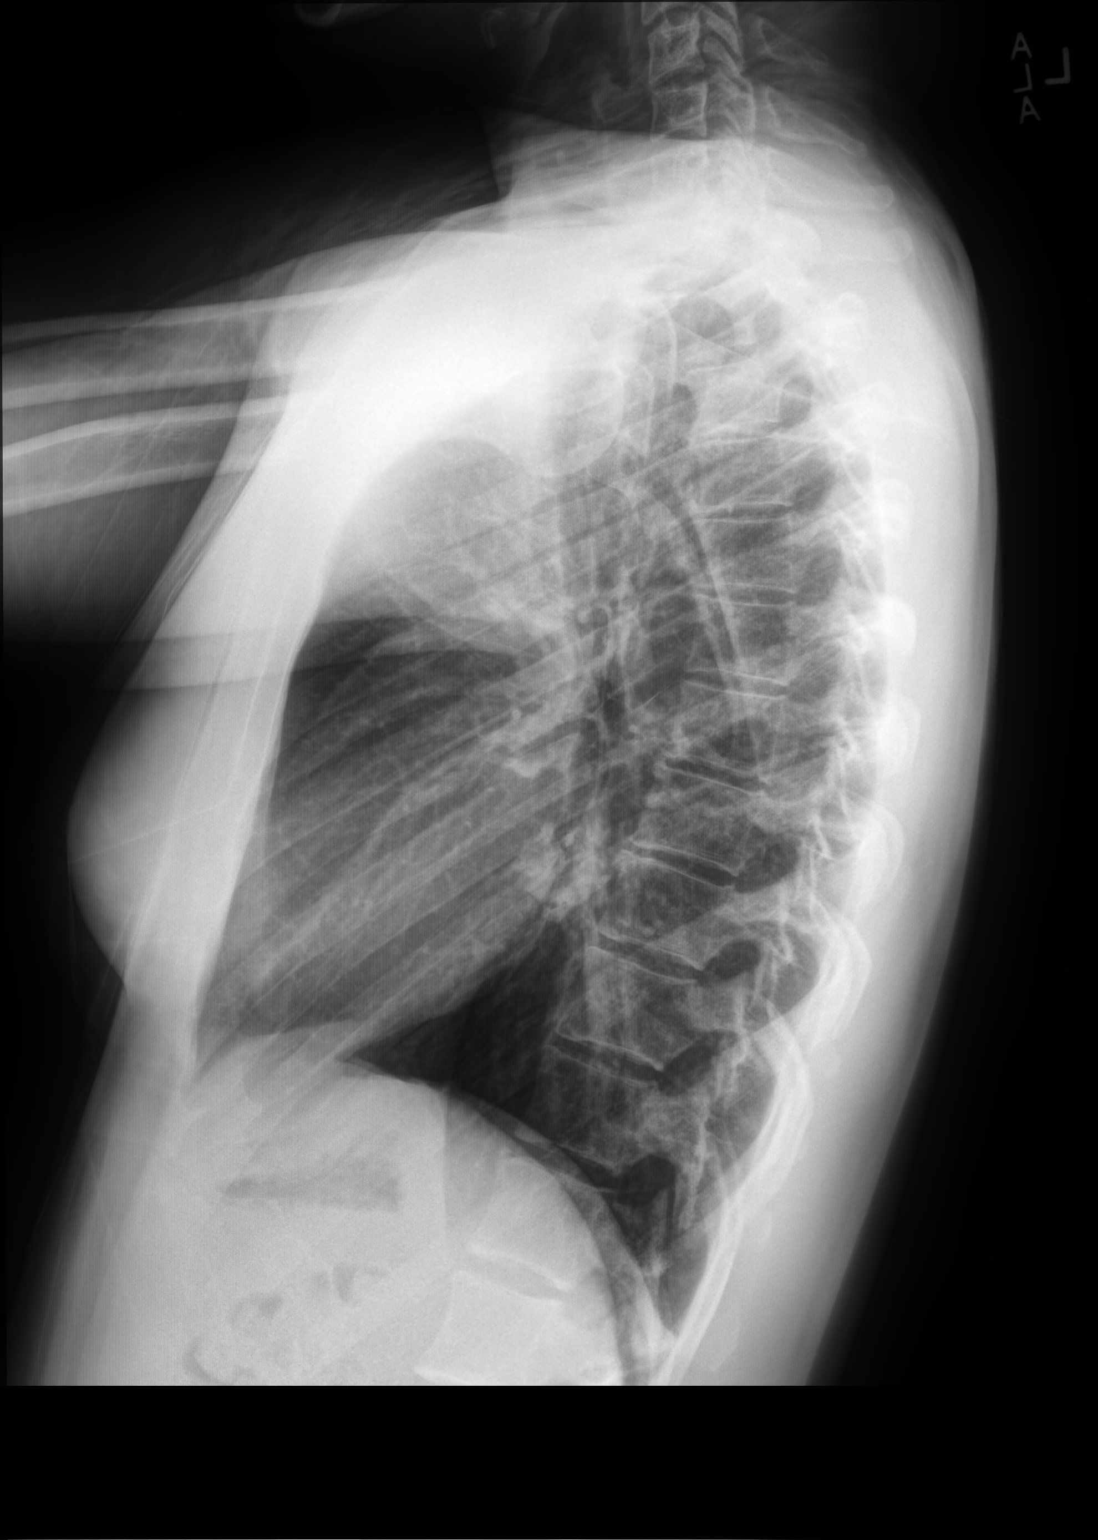

[2 of 2 positions shown; findings below may reference images not displayed]

FINDINGS: The cardiomediastinal contours are normal. The lungs are clear.
Pulmonary vasculature is normal. No consolidation, pleural effusion,
or pneumothorax. No acute osseous abnormalities are seen.
IMPRESSION: Negative radiographs of the chest.

## 2023-11-18 ENCOUNTER — Other Ambulatory Visit: Payer: Self-pay | Admitting: Nurse Practitioner

## 2023-11-18 DIAGNOSIS — Z30011 Encounter for initial prescription of contraceptive pills: Secondary | ICD-10-CM

## 2023-11-18 NOTE — Telephone Encounter (Signed)
Pt LVM in triage line requesting this refill.   Med refill request: POPs Last AEX: 12/24/2022-TW Next AEX: nothing currently scheduled. Recall sent per EMR Last MMG (if hormonal med): n/a Refill authorized: rx pend.

## 2023-11-19 NOTE — Telephone Encounter (Signed)
Pt notified that rx was sent and when her AEX is due.

## 2023-11-22 ENCOUNTER — Ambulatory Visit: Payer: BC Managed Care – PPO

## 2024-02-04 ENCOUNTER — Encounter: Payer: Self-pay | Admitting: Family Medicine

## 2024-02-05 MED ORDER — VALACYCLOVIR HCL 500 MG PO TABS: ORAL_TABLET | ORAL | 1 refills | Status: DC

## 2024-02-09 ENCOUNTER — Other Ambulatory Visit: Payer: Self-pay | Admitting: Nurse Practitioner

## 2024-02-09 DIAGNOSIS — Z30011 Encounter for initial prescription of contraceptive pills: Secondary | ICD-10-CM

## 2024-02-10 NOTE — Telephone Encounter (Signed)
 Med refill request: Daisy White Last AEX: 12/24/2022 Next AEX: not scheduled, sent msg to front desk to schedule patient for annual visit. Last MMG (if hormonal med) Refill authorized: Last Rx sent #[phone] with zero refills on 11/18/2023. Please approve or deny as appropriate.

## 2024-05-12 ENCOUNTER — Telehealth (INDEPENDENT_AMBULATORY_CARE_PROVIDER_SITE_OTHER): Payer: Self-pay | Admitting: Family Medicine

## 2024-05-12 ENCOUNTER — Encounter: Payer: Self-pay | Admitting: Family Medicine

## 2024-05-12 VITALS — Ht 62.0 in

## 2024-05-12 DIAGNOSIS — B001 Herpesviral vesicular dermatitis: Secondary | ICD-10-CM | POA: Diagnosis not present

## 2024-05-12 MED ORDER — VALACYCLOVIR HCL 500 MG PO TABS: ORAL_TABLET | ORAL | 2 refills | Status: DC

## 2024-05-12 NOTE — Progress Notes (Signed)
 Virtual Visit via Video Note I connected with Daisy White on 05/12/2024 by a video enabled telemedicine application and verified that I am speaking with the correct person using two identifiers. Location patient: home Location provider:work office Persons participating in the virtual visit: patient, provider, and scribe  I discussed the limitations of evaluation and management by telemedicine and the availability of in person appointments. The patient expressed understanding and agreed to proceed.  Chief Complaint  Patient presents with   Medication Refill   HPI: [name] is a 23 y.o. female, who is being seen for follow up. Recurrent herpes labialis, she has been taking Valtrex  500 mg almost every day for the past 6 months. While taking daily she did not have any episodes and adds they tend to occur shortly after she stops taking medication.  She states Valtrex  has helped with these episodes, but also believes since taking daily the clinical effect has possibly decreased.  In late-May she ran out of Valtrex  and had 2-3  flare ups since then.  On average she has 2-3 flare ups per month, with symptoms lasting a few days each time.  She has not identified trigger factors. Negative for associated fever, chills, sore throat, headache, focal weakness, nausea, or abdominal pain.  Since her last visit in 03/2023, she has been following with psychiatrist and gynecologist.  ROS: See pertinent positives and negatives per HPI.  Past Medical History:  Diagnosis Date   Allergy    Anxiety    Depression    Herpes     Past Surgical History:  Procedure Laterality Date   WISDOM TOOTH EXTRACTION      Family History  Problem Relation Age of Onset   Anxiety disorder Mother    Hypertension Mother    Depression Mother    Hypertension Father    Anxiety disorder Sister    Depression Sister    ADD / ADHD Sister    Heart disease Maternal Grandmother    Diabetes Maternal  Grandmother    Cancer Maternal Grandfather     Social History   Socioeconomic History   Marital status: Single    Spouse name: Not on file   Number of children: Not on file   Years of education: Not on file   Highest education level: Not on file  Occupational History   Not on file  Tobacco Use   Smoking status: Never   Smokeless tobacco: Never  Substance and Sexual Activity   Alcohol use: Yes   Drug use: No   Sexual activity: Yes    Partners: Male    Birth control/protection: Condom  Other Topics Concern   Not on file  Social History Narrative   Not on file   Social Drivers of Health   Financial Resource Strain: Not on file  Food Insecurity: Not on file  Transportation Needs: Not on file  Physical Activity: Not on file  Stress: Not on file  Social Connections: Not on file  Intimate Partner Violence: Not on file     Current Outpatient Medications:    albuterol  (VENTOLIN  HFA) 108 (90 Base) MCG/ACT inhaler, Inhale 2 puffs into the lungs every 6 (six) hours as needed for wheezing or shortness of breath., Disp: 8 g, Rfl: 0   fluticasone  (FLOVENT  HFA) 110 MCG/ACT inhaler, Inhale 2 puffs into the lungs 2 (two) times daily., Disp: 1 each, Rfl: 1   HEATHER  0.35 MG tablet, TAKE 1 TABLET BY MOUTH DAILY, Disp: 84 tablet, Rfl:  0   nitrofurantoin  (MACRODANTIN ) 50 MG capsule, 1 cap daily as needed after sex intercourse., Disp: 30 capsule, Rfl: 1   valACYclovir  (VALTREX ) 500 MG tablet, Take 1 tablet (500 mg total) by mouth daily. 1 tab bid for 3 days as needed with flare ups, start asap., Disp: 90 tablet, Rfl: 2  EXAM:  VITALS per patient if applicable:Ht 5' 2 (1.575 m)   LMP 04/19/2024   BMI 23.87 kg/m   GENERAL: alert, oriented, appears well and in no acute distress  HEENT: atraumatic, conjunctiva clear, no obvious abnormalities on inspection of external nose and ears  NECK: normal movements of the head and neck  LUNGS: on inspection no signs of respiratory distress,  breathing rate appears normal, no obvious gross SOB, gasping or wheezing  CV: no obvious cyanosis  MS: moves all visible extremities without noticeable abnormality  PSYCH/NEURO: pleasant and cooperative, no obvious depression or anxiety, speech and thought processing grossly intact  ASSESSMENT AND PLAN: Discussed the following assessment and plan:  Recurrent herpes labialis Assessment & Plan: Problem has been well-controlled while taking Valtrex  500 mg daily, without medication she has about 2-3 episodes per month. Recommend continuing Valtrex  500 mg 1 tablet twice daily for 3 days during acute flareups, take medication as soon as possible.  If problem is recurrent, she can go back to daily medication for 4 months. New prescription sent.  Orders: -     valACYclovir  HCl; Take 1 tablet (500 mg total) by mouth daily. 1 tab bid for 3 days as needed with flare ups, start asap.  Dispense: 90 tablet; Refill: 2   We discussed possible serious and likely etiologies, options for evaluation and workup, limitations of telemedicine visit vs in person visit, treatment, treatment risks and precautions. The patient was advised to call back or seek an in-person evaluation if the symptoms worsen or if the condition fails to improve as anticipated. I discussed the assessment and treatment plan with the patient. The patient was provided an opportunity to ask questions and all were answered. The patient agreed with the plan and demonstrated an understanding of the instructions.  Return in about 1 year (around 05/12/2025) for chronic problems.  I, Vernell Forest, acting as a scribe for Quinnetta Roepke [place], MD., have documented all relevant documentation on the behalf of Damiya Sandefur [place], MD, as directed by   while in the presence of Aeson Sawyers [place], MD.  I, Aissata Wilmore [place], MD, have reviewed all documentation for this visit. The documentation on 05/12/24 for the exam, diagnosis, procedures, and orders are all accurate and  complete.  Taqwa Deem G. [place], MD  [facility]. [place] office.

## 2024-05-12 NOTE — Assessment & Plan Note (Addendum)
 Problem has been well-controlled while taking Valtrex  500 mg daily, without medication she has about 2-3 episodes per month. Recommend continuing Valtrex  500 mg 1 tablet twice daily for 3 days during acute flareups, take medication as soon as possible.  If problem is recurrent, she can go back to daily medication for 4 months. New prescription sent.

## 2024-05-26 ENCOUNTER — Ambulatory Visit: Payer: Self-pay | Admitting: Nurse Practitioner

## 2024-05-26 NOTE — Progress Notes (Deleted)
   BEYONCA WISZ 10/13/2001 [id number]   History:  23 y.o. G1P0010 presents for annual exam. Monthly cycles. COCs. Seeing psychiatrist. HSV, takes valtrex  as needed. Has not had Gardasil and wants to start series today.   Gynecologic History Patient's last menstrual period was 04/19/2024.   Contraception/Family planning: none Sexually active: Yes, female and female  Health Maintenance Last Pap: 12/24/2022. Results were: Normal Last mammogram: Not indicated Last colonoscopy: Not indicated Last Dexa: Not indicated   Past medical history, past surgical history, family history and social history were all reviewed and documented in the EPIC chart. 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   ROS:  A ROS was performed and pertinent positives and negatives are included.  Exam:  There were no vitals filed for this visit.  There is no height or weight on file to calculate BMI.  General appearance:  Normal Thyroid:  Symmetrical, normal in size, without palpable masses or nodularity. Respiratory  Auscultation:  Clear without wheezing or rhonchi Cardiovascular  Auscultation:  Regular rate, without rubs, murmurs or gallops  Edema/varicosities:  Not grossly evident Abdominal  Soft,nontender, without masses, guarding or rebound.  Liver/spleen:  No organomegaly noted  Hernia:  None appreciated  Skin  Inspection:  Grossly normal Breasts: Not indicated per guidelines Genitourinary   Inguinal/mons:  Normal without inguinal adenopathy  External genitalia:  Normal appearing vulva with no masses, tenderness, or lesions  BUS/Urethra/Skene's glands:  Normal  Vagina:  Normal appearing with normal color and discharge, no lesions  Cervix:  Normal appearing without discharge or lesions  Uterus:  Normal in size, shape and contour.  Midline and mobile, nontender  Adnexa/parametria:     Rt: Normal in size, without masses or tenderness.   Lt: Normal in size, without masses or tenderness.  Anus and  perineum: Normal  Digital rectal exam: Not indicated  Patient informed chaperone available to be present for breast and pelvic exam. Patient has requested no chaperone to be present. Patient has been advised what will be completed during breast and pelvic exam.   Assessment/Plan:  23 y.o. G1P0010 for annual exam.   Well female exam with routine gynecological exam - Plan: CBC with Differential/Platelet, Comprehensive metabolic panel. Education provided on SBEs, importance of preventative screenings, current guidelines, high calcium diet, regular exercise, and multivitamin daily.   Family history of thyroid disease - Plan: TSH. Mother's side.   Need for HPV vaccine - Plan: HPV 9-valent vaccine,Recombinate. Return in 2 months for second dose.   Screening for cervical cancer - Normal pap history. Will repeat at 3-year interval per guidelines.   No follow-ups on file.    Annabella DELENA Shutter DNP, 9:44 AM 05/26/2024

## 2024-07-13 ENCOUNTER — Encounter: Payer: Self-pay | Admitting: Family Medicine

## 2024-11-02 ENCOUNTER — Other Ambulatory Visit (HOSPITAL_BASED_OUTPATIENT_CLINIC_OR_DEPARTMENT_OTHER): Payer: Self-pay

## 2024-11-02 MED ORDER — BUPROPION HCL ER (XL) 300 MG PO TB24
300.0000 mg | ORAL_TABLET | Freq: Every morning | ORAL | 0 refills | Status: AC
  Filled 2024-11-02: qty 90, 90d supply, fill #0

## 2024-11-03 ENCOUNTER — Encounter: Payer: Self-pay | Admitting: Family Medicine

## 2024-11-03 ENCOUNTER — Ambulatory Visit: Admitting: Family Medicine

## 2024-11-03 ENCOUNTER — Other Ambulatory Visit (HOSPITAL_BASED_OUTPATIENT_CLINIC_OR_DEPARTMENT_OTHER): Payer: Self-pay

## 2024-11-03 VITALS — BP 114/74 | HR 74 | Temp 97.8°F | Resp 16 | Ht 62.0 in | Wt 142.8 lb

## 2024-11-03 DIAGNOSIS — B001 Herpesviral vesicular dermatitis: Secondary | ICD-10-CM | POA: Diagnosis not present

## 2024-11-03 MED ORDER — VALACYCLOVIR HCL 500 MG PO TABS
ORAL_TABLET | ORAL | 2 refills | Status: AC
  Filled 2024-11-03: qty 30, 21d supply, fill #0

## 2024-11-03 NOTE — Assessment & Plan Note (Signed)
 With a current flare up. Start Valtrex  500 mg 1 tablet twice daily for 3 days and continue same regimen in the future with exacerbations.

## 2024-11-03 NOTE — Progress Notes (Signed)
 ACUTE VISIT Chief Complaint  Patient presents with   Medical Management of Chronic Issues    Here because she left her prescription up in [place] needs a new Rx sent to pharmacy ,   Discussed the use of AI scribe software for clinical note transcription with the patient, who gave verbal consent to proceed.  History of Present Illness Daisy White is a 23 year old female who presents with a new cold sore outbreak.  She experiences cold sore outbreaks approximately once every other month. The current outbreak began yesterday, November 02, 2024, after arriving on a trip the previous day. She did not bring her medication with her on this trip, which she typically takes as needed. No fever, chills, difficulty swallowing, or sore throat are associated with this outbreak.  She mentions that she has been frequently ill this semester, starting with the flu in September, which lasted a week and required antibiotics after she did not initially improve. Following this, she experienced a persistent cough for three weeks. In late October, she contracted COVID-19, which lasted about a week and a half.  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   Review of Systems  Constitutional:  Negative for activity change and appetite change.  HENT:  Negative for ear pain, nosebleeds and sore throat.   Respiratory:  Negative for cough, shortness of breath and wheezing.   Cardiovascular:  Negative for chest pain and leg swelling.  Musculoskeletal:  Negative for arthralgias and joint swelling.  Skin:  Negative for rash.  Neurological:  Negative for syncope, weakness and headaches.  See other pertinent positives and negatives in HPI.  Medications Ordered Prior to Encounter[1]  Past Medical History:  Diagnosis Date   Allergy    Anxiety     Depression    Herpes    Allergies[2]  Social History   Socioeconomic History   Marital status: Single    Spouse name: Not on file   Number of children: Not on file   Years of education: Not on file   Highest education level: Not on file  Occupational History   Not on file  Tobacco Use   Smoking status: Never   Smokeless tobacco: Never  Substance and Sexual Activity   Alcohol use: Yes   Drug use: No   Sexual activity: Yes    Partners: Male    Birth control/protection: Condom  Other Topics Concern   Not on file  Social History Narrative   Not on file   Social Drivers of Health   Tobacco Use: Low Risk (11/03/2024)   Patient History    Smoking Tobacco Use: Never    Smokeless Tobacco Use: Never    Passive Exposure: Not on file  Financial Resource Strain: Not on file  Food Insecurity: Not on file  Transportation Needs: Not on file  Physical Activity: Not on file  Stress: Not on file  Social Connections: Not on file  Depression (PHQ2-9): High Risk (03/29/2023)   Depression (PHQ2-9)    PHQ-2 Score: 13  Alcohol Screen: Not on file  Housing: Not on file  Utilities: Not on file  Health Literacy: Not on file    Vitals:   11/03/24 1148  BP: 114/74  Pulse: 74  Resp: 16  Temp: 97.8 F (36.6 C)  SpO2: 97%   Body mass index is 26.12 kg/m.  Physical Exam  Vitals and nursing note reviewed.  Constitutional:      General: She is not in acute distress.    Appearance: She is well-developed.  HENT:     Head: Normocephalic and atraumatic.     Mouth/Throat:     Mouth: Mucous membranes are moist.     Pharynx: Oropharynx is clear.   Eyes:     Conjunctiva/sclera: Conjunctivae normal.  Cardiovascular:     Rate and Rhythm: Normal rate and regular rhythm.     Heart sounds: Murmur (Soft SEM RUSB) heard.  Pulmonary:     Effort: Pulmonary effort is normal. No respiratory distress.     Breath sounds: Normal breath sounds.  Lymphadenopathy:     Cervical: No cervical  adenopathy.  Skin:    General: Skin is warm.     Findings: No erythema or rash.  Neurological:     Mental Status: She is alert and oriented to person, place, and time.  Psychiatric:        Mood and Affect: Mood and affect normal.    ASSESSMENT AND PLAN:  Daisy White was seen today for medical management of chronic issues, herpes labialis exacerbation.  Diagnoses and all orders for this visit:  Recurrent herpes labialis Assessment & Plan: With a current flare up. Start Valtrex  500 mg 1 tablet twice daily for 3 days and continue same regimen in the future with exacerbations.  Orders: -     valACYclovir  HCl; Take 1 tablet (500 mg total) by mouth daily. 1 tab twice daily for 3 days as needed with flare ups, start asap.  Dispense: 30 tablet; Refill: 2   Return if symptoms worsen or fail to improve.  Scott Fix G. Rosalinda Seaman, MD  [facility]. [place] office.     [1]  Current Outpatient Medications on File Prior to Visit  Medication Sig Dispense Refill   albuterol  (VENTOLIN  HFA) 108 (90 Base) MCG/ACT inhaler Inhale 2 puffs into the lungs every 6 (six) hours as needed for wheezing or shortness of breath. 8 g 0   buPROPion  (WELLBUTRIN  XL) 300 MG 24 hr tablet Take 1 tablet (300 mg total) by mouth every morning. 90 tablet 0   fluticasone  (FLOVENT  HFA) 110 MCG/ACT inhaler Inhale 2 puffs into the lungs 2 (two) times daily. 1 each 1   HEATHER  0.35 MG tablet TAKE 1 TABLET BY MOUTH DAILY 84 tablet 0   nitrofurantoin  (MACRODANTIN ) 50 MG capsule 1 cap daily as needed after sex intercourse. 30 capsule 1   No current facility-administered medications on file prior to visit.  [2] No Known Allergies
